# Patient Record
Sex: Female | Born: 1980 | Race: Black or African American | Hispanic: No | Marital: Single | Smoking: Current every day smoker
Health system: Southern US, Community
[De-identification: ages and names within clinical notes are randomized; demographics above are authoritative.]

## PROBLEM LIST (undated history)

## (undated) DIAGNOSIS — D219 Benign neoplasm of connective and other soft tissue, unspecified: Secondary | ICD-10-CM

## (undated) DIAGNOSIS — R87629 Unspecified abnormal cytological findings in specimens from vagina: Secondary | ICD-10-CM

## (undated) DIAGNOSIS — K219 Gastro-esophageal reflux disease without esophagitis: Secondary | ICD-10-CM

## (undated) DIAGNOSIS — J45909 Unspecified asthma, uncomplicated: Secondary | ICD-10-CM

## (undated) HISTORY — DX: Unspecified abnormal cytological findings in specimens from vagina: R87.629

## (undated) HISTORY — DX: Benign neoplasm of connective and other soft tissue, unspecified: D21.9

## (undated) HISTORY — DX: Unspecified asthma, uncomplicated: J45.909

## (undated) HISTORY — DX: Gastro-esophageal reflux disease without esophagitis: K21.9

---

## 2007-04-17 ENCOUNTER — Emergency Department: Payer: Self-pay | Admitting: Emergency Medicine

## 2008-06-27 ENCOUNTER — Emergency Department: Payer: Self-pay | Admitting: Internal Medicine

## 2009-01-30 ENCOUNTER — Emergency Department: Payer: Self-pay | Admitting: Emergency Medicine

## 2010-09-14 ENCOUNTER — Emergency Department: Payer: Self-pay | Admitting: Emergency Medicine

## 2011-02-05 ENCOUNTER — Emergency Department: Payer: Self-pay | Admitting: Emergency Medicine

## 2013-05-06 ENCOUNTER — Emergency Department: Payer: Self-pay | Admitting: Internal Medicine

## 2013-05-06 LAB — URINALYSIS, COMPLETE
Bilirubin,UR: NEGATIVE
Leukocyte Esterase: NEGATIVE
RBC,UR: 2 /HPF (ref 0–5)
Specific Gravity: 1.012 (ref 1.003–1.030)
Squamous Epithelial: 1
WBC UR: 1 /HPF (ref 0–5)

## 2013-05-06 LAB — HCG, QUANTITATIVE, PREGNANCY: Beta Hcg, Quant.: 8483 m[IU]/mL — ABNORMAL HIGH

## 2013-08-29 DIAGNOSIS — E039 Hypothyroidism, unspecified: Secondary | ICD-10-CM | POA: Insufficient documentation

## 2013-08-29 DIAGNOSIS — D259 Leiomyoma of uterus, unspecified: Secondary | ICD-10-CM | POA: Insufficient documentation

## 2013-11-09 DIAGNOSIS — Z8659 Personal history of other mental and behavioral disorders: Secondary | ICD-10-CM | POA: Insufficient documentation

## 2013-11-09 DIAGNOSIS — O1490 Unspecified pre-eclampsia, unspecified trimester: Secondary | ICD-10-CM | POA: Insufficient documentation

## 2013-11-09 DIAGNOSIS — O24419 Gestational diabetes mellitus in pregnancy, unspecified control: Secondary | ICD-10-CM | POA: Insufficient documentation

## 2014-04-23 ENCOUNTER — Emergency Department: Payer: Self-pay | Admitting: Emergency Medicine

## 2015-12-18 HISTORY — PX: COLPOSCOPY W/ BIOPSY / CURETTAGE: SUR283

## 2015-12-19 DIAGNOSIS — N879 Dysplasia of cervix uteri, unspecified: Secondary | ICD-10-CM | POA: Insufficient documentation

## 2015-12-25 LAB — HM PAP SMEAR

## 2016-03-30 ENCOUNTER — Encounter: Payer: Self-pay | Admitting: Emergency Medicine

## 2016-03-30 ENCOUNTER — Emergency Department
Admission: EM | Admit: 2016-03-30 | Discharge: 2016-03-30 | Disposition: A | Discharge status: Home / Self Care | Payer: Commercial Managed Care - HMO | Attending: Emergency Medicine | Admitting: Emergency Medicine

## 2016-03-30 DIAGNOSIS — F17210 Nicotine dependence, cigarettes, uncomplicated: Secondary | ICD-10-CM | POA: Diagnosis not present

## 2016-03-30 DIAGNOSIS — J029 Acute pharyngitis, unspecified: Secondary | ICD-10-CM | POA: Insufficient documentation

## 2016-03-30 DIAGNOSIS — E860 Dehydration: Secondary | ICD-10-CM

## 2016-03-30 DIAGNOSIS — J020 Streptococcal pharyngitis: Secondary | ICD-10-CM

## 2016-03-30 DIAGNOSIS — R509 Fever, unspecified: Secondary | ICD-10-CM | POA: Diagnosis present

## 2016-03-30 LAB — CBC WITH DIFFERENTIAL/PLATELET
BASOS ABS: 0 10*3/uL (ref 0–0.1)
Basophils Relative: 0 %
Eosinophils Absolute: 0 10*3/uL (ref 0–0.7)
HEMATOCRIT: 39.4 % (ref 35.0–47.0)
Hemoglobin: 13.1 g/dL (ref 12.0–16.0)
Lymphs Abs: 1.4 10*3/uL (ref 1.0–3.6)
MCH: 29.7 pg (ref 26.0–34.0)
MCHC: 33.2 g/dL (ref 32.0–36.0)
MCV: 89.7 fL (ref 80.0–100.0)
MONO ABS: 0.9 10*3/uL (ref 0.2–0.9)
NEUTROS ABS: 17 10*3/uL — AB (ref 1.4–6.5)
Neutrophils Relative %: 88 %
PLATELETS: 277 10*3/uL (ref 150–440)
RBC: 4.39 MIL/uL (ref 3.80–5.20)
RDW: 14.2 % (ref 11.5–14.5)
WBC: 19.4 10*3/uL — ABNORMAL HIGH (ref 3.6–11.0)

## 2016-03-30 LAB — COMPREHENSIVE METABOLIC PANEL
ALBUMIN: 4.1 g/dL (ref 3.5–5.0)
ALK PHOS: 48 U/L (ref 38–126)
ALT: 22 U/L (ref 14–54)
ANION GAP: 9 (ref 5–15)
AST: 25 U/L (ref 15–41)
BILIRUBIN TOTAL: 1.3 mg/dL — AB (ref 0.3–1.2)
BUN: 14 mg/dL (ref 6–20)
CO2: 22 mmol/L (ref 22–32)
Calcium: 9.1 mg/dL (ref 8.9–10.3)
Chloride: 103 mmol/L (ref 101–111)
Creatinine, Ser: 1.08 mg/dL — ABNORMAL HIGH (ref 0.44–1.00)
GFR calc Af Amer: >60 mL/min (ref >60)
GFR calc non Af Amer: >60 mL/min (ref >60)
GLUCOSE: 102 mg/dL — AB (ref 65–99)
POTASSIUM: 3.8 mmol/L (ref 3.5–5.1)
SODIUM: 134 mmol/L — AB (ref 135–145)
TOTAL PROTEIN: 8.4 g/dL — AB (ref 6.5–8.1)

## 2016-03-30 LAB — URINALYSIS COMPLETE WITH MICROSCOPIC (ARMC ONLY)
BACTERIA UA: NONE SEEN
Bilirubin Urine: NEGATIVE
GLUCOSE, UA: NEGATIVE mg/dL
Hgb urine dipstick: NEGATIVE
Ketones, ur: NEGATIVE mg/dL
Leukocytes, UA: NEGATIVE
Nitrite: NEGATIVE
PROTEIN: NEGATIVE mg/dL
SPECIFIC GRAVITY, URINE: 1.021 (ref 1.005–1.030)
pH: 7 (ref 5.0–8.0)

## 2016-03-30 LAB — POCT RAPID STREP A: Streptococcus, Group A Screen (Direct): POSITIVE — AB

## 2016-03-30 LAB — PREGNANCY, URINE: Preg Test, Ur: NEGATIVE

## 2016-03-30 MED ORDER — ACETAMINOPHEN 325 MG PO TABS
650.0000 mg | ORAL_TABLET | Freq: Once | ORAL | Status: AC | PRN
  Administered 2016-03-30: 650 mg via ORAL

## 2016-03-30 MED ORDER — SODIUM CHLORIDE 0.9 % IV BOLUS (SEPSIS)
1000.0000 mL | Freq: Once | INTRAVENOUS | Status: AC
  Administered 2016-03-30: 1000 mL via INTRAVENOUS

## 2016-03-30 MED ORDER — ACETAMINOPHEN 325 MG PO TABS
ORAL_TABLET | ORAL | Status: AC
  Administered 2016-03-30: 650 mg via ORAL
  Filled 2016-03-30: qty 2

## 2016-03-30 MED ORDER — ACETAMINOPHEN-CODEINE #3 300-30 MG PO TABS: 1.0000 | ORAL_TABLET | ORAL | Status: DC | PRN

## 2016-03-30 MED ORDER — AMOXICILLIN 500 MG PO TABS
500.0000 mg | ORAL_TABLET | Freq: Three times a day (TID) | ORAL | Status: DC
Start: 2016-03-30 — End: 2016-08-26

## 2016-03-30 NOTE — ED Notes (Signed)
Pt with sore throat, diarrhea, muscle weakness, fever 104 this morning per patient report.  Pt states she came to the ED because she was worried about the "tingling" in her legs, bilaterally.

## 2016-03-30 NOTE — ED Notes (Signed)
Pt presents with sore throat, leg pain, tingling, spasms. Pt had some diarrhea earlier today. Pt states that she thinks she was bitten by something. Pt also reports that she is confused, but when asked to expand on it, she states that she is having trouble saying what she is thinking. Pt alert & oriented.

## 2016-03-30 NOTE — Discharge Instructions (Signed)

## 2016-03-30 NOTE — ED Provider Notes (Signed)
[facility] Emergency Department Provider Note  ____________________________________________  Time seen: Approximately 3:18 PM  I have reviewed the triage vital signs and the nursing notes.   HISTORY  Chief Complaint Sore Throat; Fever; and Extremity Weakness   HPI Heidi Hill is a 35 y.o. female who presents to the emergency department for evaluation of sore throat, muscle spasms, fever, lower extremity tingling, and diarrhea that started this morning. She denies known sick contacts, however she works as a Secretary/administrator. She also has a 5-year-old that is in daycare. She denies chronic illness. She has taken Tylenol for the fever.  History reviewed. No pertinent past medical history.  There are no active problems to display for this patient.   History reviewed. No pertinent past surgical history.  Current Outpatient Rx  Name  Route  Sig  Dispense  Refill  . acetaminophen-codeine (TYLENOL #3) 300-30 MG tablet   Oral   Take 1-2 tablets by mouth every 4 (four) hours as needed for moderate pain.   12 tablet   0   . amoxicillin (AMOXIL) 500 MG tablet   Oral   Take 1 tablet (500 mg total) by mouth 3 (three) times daily.   30 tablet   0     Allergies Review of patient's allergies indicates no known allergies.  No family history on file.  Social History Social History  Substance Use Topics  . Smoking status: Current Every Day Smoker -- 0.50 packs/day    Types: Cigarettes  . Smokeless tobacco: None  . Alcohol Use: 1.2 oz/week    2 Glasses of wine per week    Review of Systems Constitutional: Positive for fever. Eyes: No visual changes. ENT: Positive for sore throat; negative for difficulty swallowing. Respiratory: Denies shortness of breath. Gastrointestinal: No abdominal pain.  No nausea, no vomiting.  No diarrhea.  Genitourinary: Negative for dysuria. Musculoskeletal: Positive for generalized body aches. Skin: Negative for  rash. Neurological: Positive for headaches, negative for focal weakness or numbness, positive for bilateral lower extremity tingling.  ____________________________________________   PHYSICAL EXAM:  VITAL SIGNS: ED Triage Vitals  Enc Vitals Group     BP 03/30/16 1407 107/64 mmHg     Pulse Rate 03/30/16 1407 127     Resp 03/30/16 1407 20     Temp 03/30/16 1407 102.8 F (39.3 C)     Temp Source 03/30/16 1407 Oral     SpO2 03/30/16 1407 98 %     Weight 03/30/16 1407 185 lb (83.915 kg)     Height 03/30/16 1407 5\' 7"  (1.702 m)     Head Cir --      Peak Flow --      Pain Score 03/30/16 1408 6     Pain Loc --      Pain Edu? --      Excl. in [place]? --     Constitutional: Alert and oriented. Well appearing and in no acute distress. Eyes: Conjunctivae are normal. PERRL. EOMI. Head: Atraumatic. Nose: No congestion/rhinnorhea. Mouth/Throat: Mucous membranes are moist.  Oropharynx Erythematous, with tonsillar exudate. Neck: No stridor.  Lymphatic: Lymphadenopathy: Anterior cervical nodes tender on palpation Cardiovascular: Normal rate, regular rhythm. Good peripheral circulation. Respiratory: Normal respiratory effort. Lungs CTAB. Gastrointestinal: Soft and nontender. Bowel sounds present 4 Musculoskeletal: No lower extremity tenderness nor edema.   Neurologic:  Normal speech and language. No gross focal neurologic deficits are appreciated. Speech is normal. No gait instability. Skin:  Skin is warm, dry and intact. No rash  noted Psychiatric: Mood and affect are normal. Speech and behavior are normal.  ____________________________________________   LABS (all labs ordered are listed, but only abnormal results are displayed)  Labs Reviewed  COMPREHENSIVE METABOLIC PANEL - Abnormal; Notable for the following:    Sodium 134 (*)    Glucose, Bld 102 (*)    Creatinine, Ser 1.08 (*)    Total Protein 8.4 (*)    Total Bilirubin 1.3 (*)    All other components within normal limits  CBC  WITH DIFFERENTIAL/PLATELET - Abnormal; Notable for the following:    WBC 19.4 (*)    Neutro Abs 17.0 (*)    All other components within normal limits  URINALYSIS COMPLETEWITH MICROSCOPIC ([facility] ONLY) - Abnormal; Notable for the following:    Color, Urine YELLOW (*)    APPearance CLEAR (*)    Squamous Epithelial / LPF 0-5 (*)    All other components within normal limits  POCT RAPID STREP A - Abnormal; Notable for the following:    Streptococcus, Group A Screen (Direct) POSITIVE (*)    All other components within normal limits  PREGNANCY, URINE   ____________________________________________  EKG   ____________________________________________  RADIOLOGY   ____________________________________________   PROCEDURES  Procedure(s) performed: None  Critical Care performed: No  ____________________________________________   INITIAL IMPRESSION / ASSESSMENT AND PLAN / ED COURSE  Pertinent labs & imaging results that were available during my care of the patient were reviewed by me and considered in my medical decision making (see chart for details).  Patient reports feeling much better after fluids and tylenol. She will be give prescriptions for Amoxicillin and tylenol #3. She was instructed to follow up with the PCP of her choice or return to the ER for symptoms that change or worsen if unable to schedule an appointment. ____________________________________________   FINAL CLINICAL IMPRESSION(S) / ED DIAGNOSES  Final diagnoses:  Dehydration, mild  Strep sore throat      Victorino Dike, FNP 03/30/16 1930  Hinda Kehr, MD 03/30/16 2031

## 2016-08-26 ENCOUNTER — Ambulatory Visit (INDEPENDENT_AMBULATORY_CARE_PROVIDER_SITE_OTHER): Payer: Commercial Managed Care - HMO | Admitting: Obstetrics and Gynecology

## 2016-08-26 ENCOUNTER — Encounter: Payer: Self-pay | Admitting: Obstetrics and Gynecology

## 2016-08-26 VITALS — BP 130/81 | HR 91 | Wt 194.8 lb

## 2016-08-26 DIAGNOSIS — N898 Other specified noninflammatory disorders of vagina: Secondary | ICD-10-CM

## 2016-08-26 DIAGNOSIS — Z720 Tobacco use: Secondary | ICD-10-CM | POA: Insufficient documentation

## 2016-08-26 DIAGNOSIS — D259 Leiomyoma of uterus, unspecified: Secondary | ICD-10-CM | POA: Diagnosis not present

## 2016-08-26 DIAGNOSIS — N946 Dysmenorrhea, unspecified: Secondary | ICD-10-CM

## 2016-08-26 DIAGNOSIS — Z7251 High risk heterosexual behavior: Secondary | ICD-10-CM | POA: Diagnosis not present

## 2016-08-26 NOTE — Patient Instructions (Signed)
1. Ultrasound is scheduled 2. Screening labs for STDs are completed today 3. Return in 2 weeks for follow-up and further management planning 4. Recommend smoking cessation if you are to remain on birth control pills   Uterine Fibroids Uterine fibroids are tissue masses (tumors) that can develop in the womb (uterus). They are also called leiomyomas. This type of tumor is not cancerous (benign) and does not spread to other parts of the body outside of the pelvic area, which is between the hip bones. Occasionally, fibroids may develop in the fallopian tubes, in the cervix, or on the support structures (ligaments) that surround the uterus. You can have one or many fibroids. Fibroids can vary in size, weight, and where they grow in the uterus. Some can become quite large. Most fibroids do not require medical treatment. CAUSES A fibroid can develop when a single uterine cell keeps growing (replicating). Most cells in the human body have a control mechanism that keeps them from replicating without control. SIGNS AND SYMPTOMS Symptoms may include:   Heavy bleeding during your period.  Bleeding or spotting between periods.  Pelvic pain and pressure.  Bladder problems, such as needing to urinate more often (urinary frequency) or urgently.  Inability to reproduce offspring (infertility).  Miscarriages. DIAGNOSIS Uterine fibroids are diagnosed through a physical exam. Your health care provider may feel the lumpy tumors during a pelvic exam. Ultrasonography and an MRI may be done to determine the size, location, and number of fibroids. TREATMENT Treatment may include:  Watchful waiting. This involves getting the fibroid checked by your health care provider to see if it grows or shrinks. Follow your health care provider's recommendations for how often to have this checked.  Hormone medicines. These can be taken by mouth or given through an intrauterine device (IUD).  Surgery.  Removing the  fibroids (myomectomy) or the uterus (hysterectomy).  Removing blood supply to the fibroids (uterine artery embolization). If fibroids interfere with your fertility and you want to become pregnant, your health care provider may recommend having the fibroids removed.  HOME CARE INSTRUCTIONS  Keep all follow-up visits as directed by your health care provider. This is important.  Take medicines only as directed by your health care provider.  If you were prescribed a hormone treatment, take the hormone medicines exactly as directed.  Do not take aspirin, because it can cause bleeding.  Ask your health care provider about taking iron pills and increasing the amount of dark green, leafy vegetables in your diet. These actions can help to boost your blood iron levels, which may be affected by heavy menstrual bleeding.  Pay close attention to your period and tell your health care provider about any changes, such as:  Increased blood flow that requires you to use more pads or tampons than usual per month.  A change in the number of days that your period lasts per month.  A change in symptoms that are associated with your period, such as abdominal cramping or back pain. SEEK MEDICAL CARE IF:  You have pelvic pain, back pain, or abdominal cramps that cannot be controlled with medicines.  You have an increase in bleeding between and during periods.  You soak tampons or pads in a half hour or less.  You feel lightheaded, extra tired, or weak. SEEK IMMEDIATE MEDICAL CARE IF:  You faint.  You have a sudden increase in pelvic pain.   This information is not intended to replace advice given to you by your health care provider.  Make sure you discuss any questions you have with your health care provider.   Document Released: 10/02/2000 Document Revised: 10/26/2014 Document Reviewed: 04/03/2014 Elsevier Interactive Patient Education [employer].    Endometriosis Endometriosis is a  condition in which the tissue that lines the uterus (endometrium) grows outside of its normal location. The tissue may grow in many locations close to the uterus, but it commonly grows on the ovaries, fallopian tubes, vagina, or bowel. Because the uterus expels, or sheds, its lining every menstrual cycle, there is bleeding wherever the endometrial tissue is located. This can cause pain because blood is irritating to tissues not normally exposed to it.  CAUSES  The cause of endometriosis is not known.  SIGNS AND SYMPTOMS  Often, there are no symptoms. When symptoms are present, they can vary with the location of the displaced tissue. Various symptoms can occur at different times. Although symptoms occur mainly during a woman's menstrual period, they can also occur midcycle and usually stop with menopause. Some people may go months with no symptoms at all. Symptoms may include:   Back or abdominal pain.   Heavier bleeding during periods.   Pain during intercourse.   Painful bowel movements.   Infertility. DIAGNOSIS  Your health care provider will do a physical exam and ask about your symptoms. Various tests may be done, such as:   Blood tests and urine tests. These are done to help rule out other problems.   Ultrasound. This test is done to look for abnormal tissue.   An X-ray of the lower bowel (barium enema).  Laparoscopy. In this procedure, a thin, lighted tube with a tiny camera on the end (laparoscope) is inserted into your abdomen. This helps your health care provider look for abnormal tissue to confirm the diagnosis. The health care provider may also remove a small piece of tissue (biopsy) from any abnormal tissue found. This tissue sample can then be sent to a lab so it can be looked at under a microscope. TREATMENT  Treatment will vary and may include:   Medicines to relieve pain. Nonsteroidal anti-inflammatory drugs (NSAIDs) are a type of pain medicine that can help to  relieve the pain caused by endometriosis.  Hormonal therapy. When using hormonal therapy, periods are eliminated. This eliminates the monthly exposure to blood by the displaced endometrial tissue.   Surgery. Surgery may sometimes be done to remove the abnormal endometrial tissue. In severe cases, surgery may be done to remove the fallopian tubes, uterus, and ovaries (hysterectomy). HOME CARE INSTRUCTIONS   Take all medicines as directed by your health care provider. Do not take aspirin because it may increase bleeding when you are not on hormonal therapy.   Avoid activities that produce pain, including sexual activity. SEEK MEDICAL CARE IF:  You have pelvic pain before, after, or during your periods.  You have pelvic pain between periods that gets worse during your period.  You have pelvic pain during or after sex.  You have pelvic pain with bowel movements or urination, especially during your period.  You have problems getting pregnant.  You have a fever. SEEK IMMEDIATE MEDICAL CARE IF:   Your pain is severe and is not responding to pain medicine.   You have severe nausea and vomiting, or you cannot keep foods down.   You have pain that is limited to the right lower part of your abdomen.   You have swelling or increasing pain in your abdomen.   You see blood in  your stool.  MAKE SURE YOU:   Understand these instructions.  Will watch your condition.  Will get help right away if you are not doing well or get worse.   This information is not intended to replace advice given to you by your health care provider. Make sure you discuss any questions you have with your health care provider.   Document Released: 10/02/2000 Document Revised: 10/26/2014 Document Reviewed: 06/02/2013 Elsevier Interactive Patient Education [employer].

## 2016-08-26 NOTE — Progress Notes (Signed)
GYN ENCOUNTER NOTE  Subjective:       Heidi Hill is a 35 y.o. G2P0111 female is here for gynecologic evaluation of the following issues:  1. Uterine fibroids 2. Severe dysmenorrhea 3. Establish care  OB history is notable for spontaneous vaginal delivery of 3 pound baby at [redacted] weeks gestation due to preterm labor and preeclampsia. Patient also had a spontaneous abortion which did not require a D&C.   GYN history notable for positive HPV on Pap smear in 2017; she is status post colposcopy without evidence of dysplasia; she is recommended to have repeat Pap smear testing in 2018. Patient has long history of severe dysmenorrhea requiring high-dose ibuprofen therapy; she has missed work because of the pain. In the past she was told that she had endometriosis but this never confirmed. She also has history of uterine fibroids identified during her pregnancy.  Gynecologic History Patient's last menstrual period was 08/16/2016. Contraception: OCP (estrogen/progesterone) Last Pap: Positive high-risk HPV; Colposcopy at [facility] in March 2017 negative Last mammogram: N/A Menarche: Age 89 Intervals: Monthly Duration: 5 days-3 days of bleeding followed by 2 days of spotting Dysmenorrhea: Severe 9/10: Requires approximately 8 or 9 Advil per day; pain is right lower quadrant and associated low back pain with radiation into the buttocks and thighs; has missed work in the past because of the severity of dysmenorrhea.  Obstetric History OB History  Gravida Para Term Preterm AB Living  2 1   1 1 1   SAB TAB Ectopic Multiple Live Births  1       1    # Outcome Date GA Lbr Len/2nd Weight Sex Delivery Anes PTL Lv  2 Preterm 11/24/13 [redacted]w[redacted]d  3 lb (1.361 kg) F Vag-Spont  Y LIV  1 SAB 2005        ND      Past Medical History:  Diagnosis Date  . Fibroids   . GERD (gastroesophageal reflux disease)     Past Surgical History:  Procedure Laterality Date  . COLPOSCOPY W/ BIOPSY / CURETTAGE  12/2015     No current outpatient prescriptions on file prior to visit.   No current facility-administered medications on file prior to visit.     No Known Allergies  Social History   Social History  . Marital status: Single    Spouse name: N/A  . Number of children: N/A  . Years of education: N/A   Occupational History  . Not on file.   Social History Main Topics  . Smoking status: Current Every Day Smoker    Packs/day: 0.50    Types: Cigarettes  . Smokeless tobacco: Never Used  . Alcohol use 1.2 oz/week    2 Glasses of wine per week  . Drug use: No  . Sexual activity: Not Currently   Other Topics Concern  . Not on file   Social History Narrative  . No narrative on file    Family History  Problem Relation Age of Onset  . Diabetes Mother     The following portions of the patient's history were reviewed and updated as appropriate: allergies, current medications, past family history, past medical history, past social history, past surgical history and problem list.  Review of Systems Review of Systems - Per history of present illness  Objective:   BP 130/81   Pulse 91   Wt 194 lb 12.8 oz (88.4 kg)   LMP 08/16/2016   BMI 30.51 kg/m  CONSTITUTIONAL: Well-developed, well-nourished female in no acute distress.  HENT:  Normocephalic, atraumatic.  NECK: Normal range of motion, supple, no masses.  Normal thyroid.  SKIN: Skin is warm and dry. No rash noted. Not diaphoretic. No erythema. No pallor. [place]: Alert and oriented to person, place, and time. PSYCHIATRIC: Normal mood and affect. Normal behavior. Normal judgment and thought content. CARDIOVASCULAR:Not Examined RESPIRATORY: Not Examined BREASTS: Not Examined ABDOMEN: Soft, non distended; Non tender.  No Organomegaly. PELVIC:  External Genitalia: Normal  BUS: Normal  Vagina: Pearline Cables white secretions vaginal vault; no lesions  Cervix: Normal; no lesions; mild cervical motion tenderness 1/4  Uterus: Enlarged,  irregular, 12-14 weeks' size, mobile, 2/4 tender  Adnexa: Normal; nonpalpable and nontender  RV: Normal external exam  Bladder: Nontender MUSCULOSKELETAL: Normal range of motion. No tenderness.  No cyanosis, clubbing, or edema.     Assessment:   1. Unprotected sex - GC/Chlamydia Probe Amp - Hepatitis B surface antigen - HIV antibody - RPR  2. Dysmenorrhea  3. Uterine leiomyoma, unspecified location, 12-14 weeks' size  4. Tobacco user, light  5. Contraception-OCPs     Plan:   1. STI screening 2. Pelvic ultrasound 3. Return in 2 weeks for follow-up and further management planning 4. Literature on fibroids and endometriosis is given 5. Endometriosis management including medical and surgical options were reviewed 6. Smoking cessation strongly encouraged  Brayton Mars, MD  Note: This dictation was prepared with Dragon dictation along with smaller phrase technology. Any transcriptional errors that result from this process are unintentional.

## 2016-08-27 LAB — HSV(HERPES SIMPLEX VRS) I + II AB-IGG

## 2016-08-27 LAB — HIV ANTIBODY (ROUTINE TESTING W REFLEX): HIV Screen 4th Generation wRfx: NONREACTIVE

## 2016-08-27 LAB — HEPATITIS B SURFACE ANTIGEN: Hepatitis B Surface Ag: NEGATIVE

## 2016-08-27 LAB — RPR: RPR: NONREACTIVE

## 2016-08-28 ENCOUNTER — Telehealth: Payer: Self-pay

## 2016-08-28 MED ORDER — METRONIDAZOLE 0.75 % VA GEL: 1.0000 | Freq: Every day | VAGINAL | 0 refills | Status: AC

## 2016-08-28 NOTE — Telephone Encounter (Signed)
Pt states she knows she has BV because she has had it before. Symptoms include: vaginal itching, odor, and discharge. Dr. Enzo Bi prescribed Metrogel Vag. Cream, 1 applicator at bedtime, x5 days. Rx sent in.

## 2016-08-29 LAB — GC/CHLAMYDIA PROBE AMP
CHLAMYDIA, DNA PROBE: NEGATIVE
Neisseria gonorrhoeae by PCR: NEGATIVE

## 2016-09-02 ENCOUNTER — Ambulatory Visit (INDEPENDENT_AMBULATORY_CARE_PROVIDER_SITE_OTHER): Payer: Commercial Managed Care - HMO

## 2016-09-02 DIAGNOSIS — D259 Leiomyoma of uterus, unspecified: Secondary | ICD-10-CM | POA: Diagnosis not present

## 2016-09-02 DIAGNOSIS — N946 Dysmenorrhea, unspecified: Secondary | ICD-10-CM | POA: Diagnosis not present

## 2016-09-15 ENCOUNTER — Encounter: Payer: Commercial Managed Care - HMO | Admitting: Obstetrics and Gynecology

## 2016-09-29 ENCOUNTER — Encounter: Payer: Commercial Managed Care - HMO | Admitting: Obstetrics and Gynecology

## 2017-02-06 ENCOUNTER — Emergency Department
Admission: EM | Admit: 2017-02-06 | Discharge: 2017-02-06 | Disposition: A | Discharge status: Home / Self Care | Payer: Commercial Managed Care - HMO | Attending: Emergency Medicine | Admitting: Emergency Medicine

## 2017-02-06 ENCOUNTER — Encounter: Payer: Self-pay | Admitting: Medical Oncology

## 2017-02-06 DIAGNOSIS — J020 Streptococcal pharyngitis: Secondary | ICD-10-CM | POA: Diagnosis not present

## 2017-02-06 DIAGNOSIS — J029 Acute pharyngitis, unspecified: Secondary | ICD-10-CM | POA: Diagnosis present

## 2017-02-06 DIAGNOSIS — F17210 Nicotine dependence, cigarettes, uncomplicated: Secondary | ICD-10-CM | POA: Diagnosis not present

## 2017-02-06 LAB — POCT RAPID STREP A: Streptococcus, Group A Screen (Direct): POSITIVE — AB

## 2017-02-06 MED ORDER — AMOXICILLIN 500 MG PO CAPS: 500.0000 mg | ORAL_CAPSULE | Freq: Two times a day (BID) | ORAL | 0 refills | Status: DC

## 2017-02-06 MED ORDER — IBUPROFEN 800 MG PO TABS
800.0000 mg | ORAL_TABLET | Freq: Once | ORAL | Status: AC
  Administered 2017-02-06: 800 mg via ORAL
  Filled 2017-02-06: qty 1

## 2017-02-06 MED ORDER — LIDOCAINE VISCOUS 2 % MT SOLN: 10.0000 mL | OROMUCOSAL | 0 refills | Status: DC | PRN

## 2017-02-06 MED ORDER — SODIUM CHLORIDE 0.9 % IV BOLUS (SEPSIS)
1000.0000 mL | Freq: Once | INTRAVENOUS | Status: AC
  Administered 2017-02-06: 1000 mL via INTRAVENOUS

## 2017-02-06 MED ORDER — AMOXICILLIN 500 MG PO CAPS
500.0000 mg | ORAL_CAPSULE | Freq: Once | ORAL | Status: AC
  Administered 2017-02-06: 500 mg via ORAL
  Filled 2017-02-06: qty 1

## 2017-02-06 NOTE — ED Notes (Signed)
Completing discharge vital signs , pt noted to have abnormal vitals , see vital flow sheet, ED provider aware, new orders received, Discharge being held til re-eval.

## 2017-02-06 NOTE — ED Notes (Addendum)
IV fluids infusing with no difficulty, pt moving about, complaining of right side chest pain under right breast. Skin warm and dry,, ED provider Caryl Pina aware,baseline EKG ordered

## 2017-02-06 NOTE — ED Provider Notes (Signed)
[facility] Emergency Department Provider Note  ____________________________________________  Time seen: Approximately 9:56 AM  I have reviewed the triage vital signs and the nursing notes.   HISTORY  Chief Complaint Sore Throat    HPI Heidi Hill is a 36 y.o. female that presents to the emergency department with one day of sore throat. Patient states that she woke up in the middle of the night with chills and this morning her throat started to hurt. She states that currenlty her muscles hurt all over. She has a Mudlogger with strep throat. She denies any recent illnesses. No alleviating measures have been tried. She denies nasal congestion, shortness of breath, chest pain, nausea, vomiting, abdominal pain, diarrhea, constipation.   Past Medical History:  Diagnosis Date  . Fibroids   . GERD (gastroesophageal reflux disease)     Patient Active Problem List   Diagnosis Date Noted  . Dysmenorrhea 08/26/2016  . Tobacco user 08/26/2016  . Cervical dysplasia 12/19/2015  . Gestational diabetes 11/09/2013  . History of depression 11/09/2013  . Preeclampsia 11/09/2013  . Thyroid activity decreased 08/29/2013  . Uterine leiomyoma 08/29/2013    Past Surgical History:  Procedure Laterality Date  . COLPOSCOPY W/ BIOPSY / CURETTAGE  12/2015    Prior to Admission medications   Medication Sig Start Date End Date Taking? Authorizing Provider  amoxicillin (AMOXIL) 500 MG capsule Take 1 capsule (500 mg total) by mouth 2 (two) times daily. 02/06/17   Laban Emperor, PA-C  lidocaine (XYLOCAINE) 2 % solution Use as directed 10 mLs in the mouth or throat as needed for mouth pain. 02/06/17   Laban Emperor, PA-C  omeprazole (PRILOSEC) 10 MG capsule Take 20 mg by mouth.    Historical Provider, MD    Allergies Patient has no known allergies.  Family History  Problem Relation Age of Onset  . Diabetes Mother     Social History Social History  Substance Use Topics   . Smoking status: Current Every Day Smoker    Packs/day: 0.50    Types: Cigarettes  . Smokeless tobacco: Never Used  . Alcohol use 1.2 oz/week    2 Glasses of wine per week     Review of Systems  Eyes: No visual changes. No discharge. ENT: Negative for congestion and rhinorrhea. Cardiovascular: No chest pain. Respiratory: Negative for cough. No SOB. Gastrointestinal: No abdominal pain.  No nausea, no vomiting.  No diarrhea.  No constipation. Skin: Negative for rash, abrasions, lacerations, ecchymosis. Neurological: Negative for headaches.   ____________________________________________   PHYSICAL EXAM:  VITAL SIGNS: ED Triage Vitals [02/06/17 0850]  Enc Vitals Group     BP 128/69     Pulse Rate (!) 107     Resp 18     Temp 98.5 F (36.9 C)     Temp Source Oral     SpO2 98 %     Weight 185 lb (83.9 kg)     Height 5\' 7"  (1.702 m)     Head Circumference      Peak Flow      Pain Score 8     Pain Loc      Pain Edu?      Excl. in [place]?      Constitutional: Alert and oriented. Well appearing and in no acute distress. Eyes: Conjunctivae are normal. PERRL. EOMI. No discharge. Head: Atraumatic. ENT: No frontal and maxillary sinus tenderness.      Ears: Tympanic membranes pearly gray with good landmarks. No discharge.  Nose: No congestion/rhinnorhea.      Mouth/Throat: Mucous membranes are moist. Oropharynx erythematous. Tonsils enlarged bilaterally. No exudates. Uvula midline. Neck: No stridor.   Hematological/Lymphatic/Immunilogical: No cervical lymphadenopathy. Cardiovascular: Normal rate, regular rhythm.  Good peripheral circulation. Respiratory: Normal respiratory effort without tachypnea or retractions. Lungs CTAB. Good air entry to the bases with no decreased or absent breath sounds. Gastrointestinal: Bowel sounds 4 quadrants. Soft and nontender to palpation. No guarding or rigidity. No palpable masses. No distention. Musculoskeletal: Full range of motion to  all extremities. No gross deformities appreciated. Neurologic:  Normal speech and language. No gross focal neurologic deficits are appreciated.  Skin:  Skin is warm, dry and intact. No rash noted.   ____________________________________________   LABS (all labs ordered are listed, but only abnormal results are displayed)  Labs Reviewed  POCT RAPID STREP A - Abnormal; Notable for the following:       Result Value   Streptococcus, Group A Screen (Direct) POSITIVE (*)    All other components within normal limits   ____________________________________________  EKG   ____________________________________________  RADIOLOGY   No results found.  ____________________________________________    PROCEDURES  Procedure(s) performed:    Procedures    Medications  ibuprofen (ADVIL,MOTRIN) tablet 800 mg (800 mg Oral Given 02/06/17 1015)  sodium chloride 0.9 % bolus 1,000 mL (0 mLs Intravenous Stopped 02/06/17 1147)  amoxicillin (AMOXIL) capsule 500 mg (500 mg Oral Given 02/06/17 1133)     ____________________________________________   INITIAL IMPRESSION / ASSESSMENT AND PLAN / ED COURSE  Pertinent labs & imaging results that were available during my care of the patient were reviewed by me and considered in my medical decision making (see chart for details).  Review of the [place] CSRS was performed in accordance of the [place] prior to dispensing any controlled drugs.   Patient's diagnosis is consistent with strep throat. Vital signs and exam are reassuring. Strep test positive. On discharge patient developed a fever of 102 and HR increased to 115. She was given fluids and ibuprofen.Temperature came down to 99.7 and HR down to 100. Patient stated that she had some pain under her right breast that felt like cramping. No cardiac changes indicated on EKG. Patient was eager to go home. Patient will be discharged home with prescriptions for amoxicillin and viscous lidocaine. Patient is to  follow up with PCP as needed or otherwise directed. Patient is given ED precautions to return to the ED for any worsening or new symptoms.     ____________________________________________  FINAL CLINICAL IMPRESSION(S) / ED DIAGNOSES  Final diagnoses:  Strep pharyngitis      NEW MEDICATIONS STARTED DURING THIS VISIT:  Discharge Medication List as of 02/06/2017 10:01 AM    START taking these medications   Details  amoxicillin (AMOXIL) 500 MG capsule Take 1 capsule (500 mg total) by mouth 2 (two) times daily., Starting Sat 02/06/2017, Print    lidocaine (XYLOCAINE) 2 % solution Use as directed 10 mLs in the mouth or throat as needed for mouth pain., Starting Sat 02/06/2017, Print            This chart was dictated using voice recognition software/Dragon. Despite best efforts to proofread, errors can occur which can change the meaning. Any change was purely unintentional.    Laban Emperor, PA-C 02/06/17 1215    Lavonia Drafts, MD 02/06/17 1248

## 2017-02-06 NOTE — ED Triage Notes (Signed)
Sore throat that began last night. 

## 2017-02-06 NOTE — ED Provider Notes (Signed)
Apolonio Schneiders, attending physician, personally viewed and interpreted this EKG  EKG Time: 1113 Rate: 105 Rhythm: sinus tachycardia Axis: normal Intervals: qtc 457 QRS: narrow ST changes: no st elevation Impression: sinus tachycardia, otherwise normal ekg    Nance Pear, MD 02/06/17 1119

## 2017-09-24 ENCOUNTER — Emergency Department: Payer: 59

## 2017-09-24 ENCOUNTER — Emergency Department
Admission: EM | Admit: 2017-09-24 | Discharge: 2017-09-24 | Disposition: A | Discharge status: Home / Self Care | Payer: 59 | Attending: Emergency Medicine | Admitting: Emergency Medicine

## 2017-09-24 ENCOUNTER — Encounter: Payer: Self-pay | Admitting: Emergency Medicine

## 2017-09-24 DIAGNOSIS — Z79899 Other long term (current) drug therapy: Secondary | ICD-10-CM | POA: Diagnosis not present

## 2017-09-24 DIAGNOSIS — W2203XA Walked into furniture, initial encounter: Secondary | ICD-10-CM | POA: Diagnosis not present

## 2017-09-24 DIAGNOSIS — F17210 Nicotine dependence, cigarettes, uncomplicated: Secondary | ICD-10-CM | POA: Insufficient documentation

## 2017-09-24 DIAGNOSIS — Z8632 Personal history of gestational diabetes: Secondary | ICD-10-CM | POA: Diagnosis not present

## 2017-09-24 DIAGNOSIS — S90121A Contusion of right lesser toe(s) without damage to nail, initial encounter: Secondary | ICD-10-CM

## 2017-09-24 DIAGNOSIS — Y999 Unspecified external cause status: Secondary | ICD-10-CM | POA: Diagnosis not present

## 2017-09-24 DIAGNOSIS — S99921A Unspecified injury of right foot, initial encounter: Secondary | ICD-10-CM | POA: Diagnosis present

## 2017-09-24 DIAGNOSIS — Y939 Activity, unspecified: Secondary | ICD-10-CM | POA: Diagnosis not present

## 2017-09-24 DIAGNOSIS — Y929 Unspecified place or not applicable: Secondary | ICD-10-CM | POA: Insufficient documentation

## 2017-09-24 MED ORDER — TRAMADOL HCL 50 MG PO TABS: 50.0000 mg | ORAL_TABLET | Freq: Four times a day (QID) | ORAL | 0 refills | Status: DC | PRN

## 2017-09-24 NOTE — ED Provider Notes (Signed)
[facility] Emergency Department Provider Note ____________________________________________  Time seen: Approximately 4:15 PM  I have reviewed the triage vital signs and the nursing notes.   HISTORY  Chief Complaint Toe Pain    HPI Heidi Hill is a 36 y.o. female who presents to the emergency department for evaluation of pain in the right fifth toe after she struck it on the bed this morning.  She states that as the hours passed, it began to become more painful, blue, and swelling.  She is having trouble putting on her shoe.  She has not taken any medications since the injury.  Past Medical History:  Diagnosis Date  . Fibroids   . GERD (gastroesophageal reflux disease)     Patient Active Problem List   Diagnosis Date Noted  . Dysmenorrhea 08/26/2016  . Tobacco user 08/26/2016  . Cervical dysplasia 12/19/2015  . Gestational diabetes 11/09/2013  . History of depression 11/09/2013  . Preeclampsia 11/09/2013  . Thyroid activity decreased 08/29/2013  . Uterine leiomyoma 08/29/2013    Past Surgical History:  Procedure Laterality Date  . COLPOSCOPY W/ BIOPSY / CURETTAGE  12/2015    Prior to Admission medications   Medication Sig Start Date End Date Taking? Authorizing Provider  amoxicillin (AMOXIL) 500 MG capsule Take 1 capsule (500 mg total) by mouth 2 (two) times daily. 02/06/17   Laban Emperor, PA-C  lidocaine (XYLOCAINE) 2 % solution Use as directed 10 mLs in the mouth or throat as needed for mouth pain. 02/06/17   Laban Emperor, PA-C  omeprazole (PRILOSEC) 10 MG capsule Take 20 mg by mouth.    [provider]  traMADol (ULTRAM) 50 MG tablet Take 1 tablet (50 mg total) by mouth every 6 (six) hours as needed. 09/24/17   Victorino Dike, FNP    Allergies Patient has no known allergies.  Family History  Problem Relation Age of Onset  . Diabetes Mother     Social History Social History   Tobacco Use  . Smoking status: Current  Every Day Smoker    Packs/day: 0.50    Types: Cigarettes  . Smokeless tobacco: Never Used  Substance Use Topics  . Alcohol use: Yes    Alcohol/week: 1.2 oz    Types: 2 Glasses of wine per week  . Drug use: No    Review of Systems Constitutional: Negative for recent illness Cardiovascular: Negative for chest pain Respiratory: Negative for shortness of breath or cough Musculoskeletal: Positive for right fifth toe pain Skin: Positive for bruising and swelling of the right fifth toe Neurological: Negative for paresthesias  ____________________________________________   PHYSICAL EXAM:  VITAL SIGNS: ED Triage Vitals  Enc Vitals Group     BP 09/24/17 0852 125/88     Pulse Rate 09/24/17 0852 79     Resp 09/24/17 0852 16     Temp 09/24/17 0852 98 F (36.7 C)     Temp Source 09/24/17 0852 Oral     SpO2 09/24/17 0852 98 %     Weight 09/24/17 0855 180 lb (81.6 kg)     Height 09/24/17 0855 5\' 7"  (1.702 m)     Head Circumference --      Peak Flow --      Pain Score 09/24/17 0855 9     Pain Loc --      Pain Edu? --      Excl. in [place]? --     Constitutional: Alert and oriented. Well appearing and in no acute distress. Eyes:  Conjunctivae are clear without discharge or drainage Head: Atraumatic Neck: Supple Respiratory: Respirations even and unlabored Musculoskeletal: Assessment of right foot reveals swelling and ecchymosis of the small toe without obvious deformity. Neurologic: Sensation of the right foot is intact Skin: Ecchymosis over the right fifth toe Psychiatric: Behavior and affect are appropriate  ____________________________________________   LABS (all labs ordered are listed, but only abnormal results are displayed)  Labs Reviewed - No data to display ____________________________________________  RADIOLOGY  Image of the fifth toe right foot negative for acute bony abnormality per  radiology ____________________________________________   PROCEDURES  Procedures  ____________________________________________   INITIAL IMPRESSION / ASSESSMENT AND PLAN / ED COURSE  Heidi Hill is a 36 y.o. female who presents to the emergency department for evaluation of pain after stubbing her toe on bed this morning.  While in the ER, her small toe was buddy taped to her fourth toe on the right foot and she was placed in a postop shoe.  Home care instructions were discussed.  She is to follow-up with her primary care provider if her symptoms are not improving over the next few days.  She was encouraged to return to the emergency department for symptoms of change or worsen if she is unable to schedule an appointment.  Medications - No data to display  Pertinent labs & imaging results that were available during my care of the patient were reviewed by me and considered in my medical decision making (see chart for details).  _________________________________________   FINAL CLINICAL IMPRESSION(S) / ED DIAGNOSES  Final diagnoses:  Contusion of fifth toe, right, initial encounter    ED Discharge Orders        Ordered    traMADol (ULTRAM) 50 MG tablet  Every 6 hours PRN     09/24/17 0943       If controlled substance prescribed during this visit, 12 month history viewed on the [place] prior to issuing an initial prescription for Schedule II or III opiod.    Victorino Dike, FNP 09/24/17 [place], [place], MD 09/25/17 [phone]

## 2017-09-24 NOTE — ED Notes (Signed)
First Nurse Note:  Patient states she stubbed her right pinky toe on her bed this morning and now toe is swollen and purple.

## 2017-09-24 NOTE — ED Triage Notes (Signed)
Patient is complaining of pain to right pinky toe after hitting it on the bed this morning.

## 2017-09-24 NOTE — ED Notes (Signed)
See triage note  States she hit her bed  Pain and bruising noted to right 5 th toe

## 2017-11-26 DIAGNOSIS — J028 Acute pharyngitis due to other specified organisms: Secondary | ICD-10-CM | POA: Diagnosis not present

## 2017-11-26 DIAGNOSIS — J029 Acute pharyngitis, unspecified: Secondary | ICD-10-CM | POA: Diagnosis not present

## 2018-09-01 ENCOUNTER — Ambulatory Visit (INDEPENDENT_AMBULATORY_CARE_PROVIDER_SITE_OTHER): Payer: BLUE CROSS/BLUE SHIELD | Admitting: Obstetrics and Gynecology

## 2018-09-01 ENCOUNTER — Other Ambulatory Visit (HOSPITAL_COMMUNITY)
Admission: RE | Admit: 2018-09-01 | Discharge: 2018-09-01 | Disposition: A | Discharge status: Home / Self Care | Payer: BLUE CROSS/BLUE SHIELD | Source: Ambulatory Visit | Attending: Obstetrics and Gynecology | Admitting: Obstetrics and Gynecology

## 2018-09-01 ENCOUNTER — Encounter: Payer: Self-pay | Admitting: Obstetrics and Gynecology

## 2018-09-01 VITALS — BP 128/85 | Ht 67.0 in | Wt 202.2 lb

## 2018-09-01 DIAGNOSIS — N946 Dysmenorrhea, unspecified: Secondary | ICD-10-CM

## 2018-09-01 DIAGNOSIS — D259 Leiomyoma of uterus, unspecified: Secondary | ICD-10-CM

## 2018-09-01 DIAGNOSIS — Z202 Contact with and (suspected) exposure to infections with a predominantly sexual mode of transmission: Secondary | ICD-10-CM | POA: Diagnosis not present

## 2018-09-01 DIAGNOSIS — Z124 Encounter for screening for malignant neoplasm of cervix: Secondary | ICD-10-CM | POA: Insufficient documentation

## 2018-09-01 DIAGNOSIS — Z720 Tobacco use: Secondary | ICD-10-CM | POA: Diagnosis not present

## 2018-09-01 DIAGNOSIS — Z01419 Encounter for gynecological examination (general) (routine) without abnormal findings: Secondary | ICD-10-CM | POA: Insufficient documentation

## 2018-09-01 DIAGNOSIS — J45909 Unspecified asthma, uncomplicated: Secondary | ICD-10-CM | POA: Insufficient documentation
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
## 2018-09-01 NOTE — Patient Instructions (Addendum)
1.  Pap smear is done. 2.  Self breast awareness is encouraged. 3.  Screening labs are ordered.  STD testing will be performed today. 4.  Continue with healthy healthy eating and exercise. 5.  Contraception-vasectomy 6.  Pelvic ultrasound is ordered to assess uterine fibroids and ovaries 7.  Return in January  2020 for follow-up on fibroids-Dr. Amalia Hailey 8.  Return in 1 year for annual exam-Dr. Amalia Hailey 9.  Hysterectomy will be scheduled at follow-up visit with Dr. Amalia Hailey in January 2020 10.  Smoking cessation strongly encouraged   Hysterectomy Information A hysterectomy is a surgery to remove your uterus. After surgery, you will no longer have periods. Also, you will not be able to get pregnant. Reasons for this surgery  You have bleeding that is not normal and keeps coming back.  You have lasting (chronic) lower belly (pelvic) pain.  You have a lasting infection.  The lining of your uterus grows outside your uterus.  The lining of your uterus grows in the muscle of your uterus.  Your uterus falls down into your vagina.  You have a growth in your uterus that causes problems.  You have cells that could turn into cancer (precancerous cells).  You have cancer of the uterus or cervix. Types There are 3 types of hysterectomies. Depending on the type, the surgery will:  Remove the top part of the uterus only.  Remove the uterus and the cervix.  Remove the uterus, cervix, and tissue that holds the uterus in place in the lower belly.  Ways a hysterectomy can be performed There are 5 ways this surgery can be performed.  A cut (incision) is made in the belly (abdomen). The uterus is taken out through the cut.  A cut is made in the vagina. The uterus is taken out through the cut.  Three or four cuts are made in the belly. A surgical device with a camera is put through one of the cuts. The uterus is cut into small pieces. The uterus is taken out through the cuts or the vagina.  Three  or four cuts are made in the belly. A surgical device with a camera is put through one of the cuts. The uterus is taken out through the vagina.  Three or four cuts are made in the belly. A surgical device that is controlled by a computer makes a visual image. The device helps the surgeon control the surgical tools. The uterus is cut into small pieces. The pieces are taken out through the cuts or through the vagina.  What can I expect after the surgery?  You will be given pain medicine.  You will need help at home for 3-5 days after surgery.  You will need to see your doctor in 2-4 weeks after surgery.  You may get hot flashes, have night sweats, and have trouble sleeping.  You may need to have Pap tests in the future if your surgery was related to cancer. Talk to your doctor. It is still good to have regular exams. This information is not intended to replace advice given to you by your health care provider. Make sure you discuss any questions you have with your health care provider. Document Released: 12/28/2011 Document Revised: 03/12/2016 Document Reviewed: 06/12/2013 Elsevier Interactive Patient Education  2018 [place] Maintenance, Female Adopting a healthy lifestyle and getting preventive care can go a long way to promote health and wellness. Talk with your health care provider about what schedule of regular  examinations is right for you. This is a good chance for you to check in with your provider about disease prevention and staying healthy. In between checkups, there are plenty of things you can do on your own. Experts have done a lot of research about which lifestyle changes and preventive measures are most likely to keep you healthy. Ask your health care provider for more information. Weight and diet Eat a healthy diet  Be sure to include plenty of vegetables, fruits, low-fat dairy products, and lean protein.  Do not eat a lot of foods high in solid fats, added  sugars, or salt.  Get regular exercise. This is one of the most important things you can do for your health. ? Most adults should exercise for at least 150 minutes each week. The exercise should increase your heart rate and make you sweat (moderate-intensity exercise). ? Most adults should also do strengthening exercises at least twice a week. This is in addition to the moderate-intensity exercise.  Maintain a healthy weight  Body mass index (BMI) is a measurement that can be used to identify possible weight problems. It estimates body fat based on height and weight. Your health care provider can help determine your BMI and help you achieve or maintain a healthy weight.  For females 49 years of age and older: ? A BMI below 18.5 is considered underweight. ? A BMI of 18.5 to 24.9 is normal. ? A BMI of 25 to 29.9 is considered overweight. ? A BMI of 30 and above is considered obese.  Watch levels of cholesterol and blood lipids  You should start having your blood tested for lipids and cholesterol at 38 years of age, then have this test every 5 years.  You may need to have your cholesterol levels checked more often if: ? Your lipid or cholesterol levels are high. ? You are older than 38 years of age. ? You are at high risk for heart disease.  Cancer screening Lung Cancer  Lung cancer screening is recommended for adults 76-34 years old who are at high risk for lung cancer because of a history of smoking.  A yearly low-dose CT scan of the lungs is recommended for people who: ? Currently smoke. ? Have quit within the past 15 years. ? Have at least a 30-pack-year history of smoking. A pack year is smoking an average of one pack of cigarettes a day for 1 year.  Yearly screening should continue until it has been 15 years since you quit.  Yearly screening should stop if you develop a health problem that would prevent you from having lung cancer treatment.  Breast Cancer  Practice breast  self-awareness. This means understanding how your breasts normally appear and feel.  It also means doing regular breast self-exams. Let your health care provider know about any changes, no matter how small.  If you are in your 20s or 30s, you should have a clinical breast exam (CBE) by a health care provider every 1-3 years as part of a regular health exam.  If you are 46 or older, have a CBE every year. Also consider having a breast X-ray (mammogram) every year.  If you have a family history of breast cancer, talk to your health care provider about genetic screening.  If you are at high risk for breast cancer, talk to your health care provider about having an MRI and a mammogram every year.  Breast cancer gene (BRCA) assessment is recommended for women who have family  members with BRCA-related cancers. BRCA-related cancers include: ? Breast. ? Ovarian. ? Tubal. ? Peritoneal cancers.  Results of the assessment will determine the need for genetic counseling and BRCA1 and BRCA2 testing.  Cervical Cancer Your health care provider may recommend that you be screened regularly for cancer of the pelvic organs (ovaries, uterus, and vagina). This screening involves a pelvic examination, including checking for microscopic changes to the surface of your cervix (Pap test). You may be encouraged to have this screening done every 3 years, beginning at age 97.  For women ages 40-65, health care providers may recommend pelvic exams and Pap testing every 3 years, or they may recommend the Pap and pelvic exam, combined with testing for human papilloma virus (HPV), every 5 years. Some types of HPV increase your risk of cervical cancer. Testing for HPV may also be done on women of any age with unclear Pap test results.  Other health care providers may not recommend any screening for nonpregnant women who are considered low risk for pelvic cancer and who do not have symptoms. Ask your health care provider if a  screening pelvic exam is right for you.  If you have had past treatment for cervical cancer or a condition that could lead to cancer, you need Pap tests and screening for cancer for at least 20 years after your treatment. If Pap tests have been discontinued, your risk factors (such as having a new sexual partner) need to be reassessed to determine if screening should resume. Some women have medical problems that increase the chance of getting cervical cancer. In these cases, your health care provider may recommend more frequent screening and Pap tests.  Colorectal Cancer  This type of cancer can be detected and often prevented.  Routine colorectal cancer screening usually begins at 37 years of age and continues through 38 years of age.  Your health care provider may recommend screening at an earlier age if you have risk factors for colon cancer.  Your health care provider may also recommend using home test kits to check for hidden blood in the stool.  A small camera at the end of a tube can be used to examine your colon directly (sigmoidoscopy or colonoscopy). This is done to check for the earliest forms of colorectal cancer.  Routine screening usually begins at age 41.  Direct examination of the colon should be repeated every 5-10 years through 38 years of age. However, you may need to be screened more often if early forms of precancerous polyps or small growths are found.  Skin Cancer  Check your skin from head to toe regularly.  Tell your health care provider about any new moles or changes in moles, especially if there is a change in a mole's shape or color.  Also tell your health care provider if you have a mole that is larger than the size of a pencil eraser.  Always use sunscreen. Apply sunscreen liberally and repeatedly throughout the day.  Protect yourself by wearing long sleeves, pants, a wide-brimmed hat, and sunglasses whenever you are outside.  Heart disease, diabetes, and  high blood pressure  High blood pressure causes heart disease and increases the risk of stroke. High blood pressure is more likely to develop in: ? People who have blood pressure in the high end of the normal range (130-139/85-89 mm Hg). ? People who are overweight or obese. ? People who are African American.  If you are 7-36 years of age, have your blood pressure  checked every 3-5 years. If you are 26 years of age or older, have your blood pressure checked every year. You should have your blood pressure measured twice-once when you are at a hospital or clinic, and once when you are not at a hospital or clinic. Record the average of the two measurements. To check your blood pressure when you are not at a hospital or clinic, you can use: ? An automated blood pressure machine at a pharmacy. ? A home blood pressure monitor.  If you are between 16 years and 56 years old, ask your health care provider if you should take aspirin to prevent strokes.  Have regular diabetes screenings. This involves taking a blood sample to check your fasting blood sugar level. ? If you are at a normal weight and have a low risk for diabetes, have this test once every three years after 38 years of age. ? If you are overweight and have a high risk for diabetes, consider being tested at a younger age or more often. Preventing infection Hepatitis B  If you have a higher risk for hepatitis B, you should be screened for this virus. You are considered at high risk for hepatitis B if: ? You were born in a country where hepatitis B is common. Ask your health care provider which countries are considered high risk. ? Your parents were born in a high-risk country, and you have not been immunized against hepatitis B (hepatitis B vaccine). ? You have HIV or AIDS. ? You use needles to inject street drugs. ? You live with someone who has hepatitis B. ? You have had sex with someone who has hepatitis B. ? You get hemodialysis  treatment. ? You take certain medicines for conditions, including cancer, organ transplantation, and autoimmune conditions.  Hepatitis C  Blood testing is recommended for: ? Everyone born from [birth year] through 1966. ? Anyone with known risk factors for hepatitis C.  Sexually transmitted infections (STIs)  You should be screened for sexually transmitted infections (STIs) including gonorrhea and chlamydia if: ? You are sexually active and are younger than 38 years of age. ? You are older than 38 years of age and your health care provider tells you that you are at risk for this type of infection. ? Your sexual activity has changed since you were last screened and you are at an increased risk for chlamydia or gonorrhea. Ask your health care provider if you are at risk.  If you do not have HIV, but are at risk, it may be recommended that you take a prescription medicine daily to prevent HIV infection. This is called pre-exposure prophylaxis (PrEP). You are considered at risk if: ? You are sexually active and do not regularly use condoms or know the HIV status of your partner(s). ? You take drugs by injection. ? You are sexually active with a partner who has HIV.  Talk with your health care provider about whether you are at high risk of being infected with HIV. If you choose to begin PrEP, you should first be tested for HIV. You should then be tested every 3 months for as long as you are taking PrEP. Pregnancy  If you are premenopausal and you may become pregnant, ask your health care provider about preconception counseling.  If you may become pregnant, take 400 to 800 micrograms (mcg) of folic acid every day.  If you want to prevent pregnancy, talk to your health care provider about birth control (contraception). Osteoporosis and menopause  Osteoporosis is a disease in which the bones lose minerals and strength with aging. This can result in serious bone fractures. Your risk for osteoporosis  can be identified using a bone density scan.  If you are 40 years of age or older, or if you are at risk for osteoporosis and fractures, ask your health care provider if you should be screened.  Ask your health care provider whether you should take a calcium or vitamin D supplement to lower your risk for osteoporosis.  Menopause may have certain physical symptoms and risks.  Hormone replacement therapy may reduce some of these symptoms and risks. Talk to your health care provider about whether hormone replacement therapy is right for you. Follow these instructions at home:  Schedule regular health, dental, and eye exams.  Stay current with your immunizations.  Do not use any tobacco products including cigarettes, chewing tobacco, or electronic cigarettes.  If you are pregnant, do not drink alcohol.  If you are breastfeeding, limit how much and how often you drink alcohol.  Limit alcohol intake to no more than 1 drink per day for nonpregnant women. One drink equals 12 ounces of beer, 5 ounces of wine, or 1 ounces of hard liquor.  Do not use street drugs.  Do not share needles.  Ask your health care provider for help if you need support or information about quitting drugs.  Tell your health care provider if you often feel depressed.  Tell your health care provider if you have ever been abused or do not feel safe at home. This information is not intended to replace advice given to you by your health care provider. Make sure you discuss any questions you have with your health care provider. Document Released: 04/20/2011 Document Revised: 03/12/2016 Document Reviewed: 07/09/2015 Elsevier Interactive Patient Education  [employer].

## 2018-09-02 LAB — HEPATITIS B SURFACE ANTIGEN: HEP B S AG: NEGATIVE

## 2018-09-02 LAB — RPR: RPR Ser Ql: NONREACTIVE

## 2018-09-02 LAB — HIV ANTIBODY (ROUTINE TESTING W REFLEX): HIV Screen 4th Generation wRfx: NONREACTIVE

## 2018-09-02 LAB — HSV(HERPES SIMPLEX VRS) I + II AB-IGG
HSV 1 Glycoprotein G Ab, IgG: <0.91 index (ref 0.00–0.90)
HSV 2 IgG, Type Spec: <0.91 index (ref 0.00–0.90)

## 2018-09-02 LAB — HEPATITIS C ANTIBODY

## 2018-09-07 LAB — CYTOLOGY - PAP
CHLAMYDIA, DNA PROBE: NEGATIVE
Diagnosis: UNDETERMINED — AB
HPV: NOT DETECTED
NEISSERIA GONORRHEA: NEGATIVE
TRICH (WINDOWPATH): NEGATIVE

## 2018-10-11 ENCOUNTER — Encounter: Payer: Self-pay | Admitting: Emergency Medicine

## 2018-10-11 ENCOUNTER — Other Ambulatory Visit: Payer: Self-pay

## 2018-10-11 ENCOUNTER — Emergency Department: Payer: BLUE CROSS/BLUE SHIELD

## 2018-10-11 ENCOUNTER — Emergency Department
Admission: EM | Admit: 2018-10-11 | Discharge: 2018-10-11 | Disposition: A | Discharge status: Home / Self Care | Payer: BLUE CROSS/BLUE SHIELD | Attending: Emergency Medicine | Admitting: Emergency Medicine

## 2018-10-11 DIAGNOSIS — J45909 Unspecified asthma, uncomplicated: Secondary | ICD-10-CM | POA: Insufficient documentation

## 2018-10-11 DIAGNOSIS — R05 Cough: Secondary | ICD-10-CM | POA: Diagnosis not present

## 2018-10-11 DIAGNOSIS — J101 Influenza due to other identified influenza virus with other respiratory manifestations: Secondary | ICD-10-CM | POA: Diagnosis not present

## 2018-10-11 DIAGNOSIS — R509 Fever, unspecified: Secondary | ICD-10-CM | POA: Diagnosis not present

## 2018-10-11 DIAGNOSIS — F17210 Nicotine dependence, cigarettes, uncomplicated: Secondary | ICD-10-CM | POA: Diagnosis not present

## 2018-10-11 LAB — INFLUENZA PANEL BY PCR (TYPE A & B)
Influenza A By PCR: POSITIVE — AB
Influenza B By PCR: NEGATIVE

## 2018-10-11 MED ORDER — GUAIFENESIN-CODEINE 100-10 MG/5ML PO SOLN: 5.0000 mL | Freq: Four times a day (QID) | ORAL | 0 refills | Status: DC | PRN

## 2018-10-11 MED ORDER — OSELTAMIVIR PHOSPHATE 75 MG PO CAPS: 75.0000 mg | ORAL_CAPSULE | Freq: Two times a day (BID) | ORAL | 0 refills | Status: AC

## 2018-10-11 NOTE — ED Provider Notes (Signed)
[facility] Emergency Department Provider Note   ____________________________________________   First MD Initiated Contact with Patient 10/11/18 1620     (approximate)  I have reviewed the triage vital signs and the nursing notes.   HISTORY  Chief Complaint Generalized Body Aches  HPI Heidi Hill is a 37 y.o. female Heidi Hill to the ED with complaint of generalized body aches, fever, cough that started yesterday.  Patient states that the fever and body aches have gotten worse today.  She has had minimal relief with over-the-counter medication.  Her pain as an 8 out of 10.   Past Medical History:  Diagnosis Date  . Fibroids   . GERD (gastroesophageal reflux disease)     Patient Active Problem List   Diagnosis Date Noted  . Asthma 09/01/2018  . Dysmenorrhea 08/26/2016  . Tobacco user 08/26/2016  . Cervical dysplasia 12/19/2015  . History of depression 11/09/2013  . Thyroid activity decreased 08/29/2013  . Uterine leiomyoma 08/29/2013    Past Surgical History:  Procedure Laterality Date  . COLPOSCOPY W/ BIOPSY / CURETTAGE  12/2015    Prior to Admission medications   Medication Sig Start Date End Date Taking? Authorizing Provider  guaiFENesin-codeine 100-10 MG/5ML syrup Take 5 mLs by mouth every 6 (six) hours as needed. 10/11/18   Johnn Hai, PA-C  omeprazole (PRILOSEC) 10 MG capsule Take 20 mg by mouth.    [provider]  oseltamivir (TAMIFLU) 75 MG capsule Take 1 capsule (75 mg total) by mouth 2 (two) times daily for 5 days. 10/11/18 10/16/18  Johnn Hai, PA-C    Allergies Patient has no known allergies.  Family History  Problem Relation Age of Onset  . Diabetes Mother     Social History Social History   Tobacco Use  . Smoking status: Current Every Day Smoker    Packs/day: 0.50    Types: Cigarettes  . Smokeless tobacco: Never Used  Substance Use Topics  . Alcohol use: Yes    Alcohol/week: 2.0 standard  drinks    Types: 2 Glasses of wine per week  . Drug use: No    Review of Systems Constitutional: Positive fever/chills Eyes: No visual changes. ENT: No sore throat. Cardiovascular: Denies chest pain. Respiratory: Denies shortness of breath. Gastrointestinal: No abdominal pain.  No nausea, no vomiting.  No diarrhea.  Genitourinary: Negative for dysuria. Musculoskeletal: Positive for body aches. Skin: Negative for rash. Neurological: Negative for headaches, focal weakness or numbness. ____________________________________________   PHYSICAL EXAM:  VITAL SIGNS: ED Triage Vitals  Enc Vitals Group     BP 10/11/18 1546 129/61     Pulse Rate 10/11/18 1546 (!) 114     Resp 10/11/18 1546 19     Temp 10/11/18 1546 99.1 F (37.3 C)     Temp Source 10/11/18 1546 Oral     SpO2 10/11/18 1546 96 %     Weight 10/11/18 1549 200 lb (90.7 kg)     Height 10/11/18 1549 5\' 7"  (1.702 m)     Head Circumference --      Peak Flow --      Pain Score 10/11/18 1549 8     Pain Loc --      Pain Edu? --      Excl. in [place]? --    Constitutional: Alert and oriented. Well appearing and in no acute distress. Eyes: Conjunctivae are normal.  Head: Atraumatic. Nose: Moderate congestion/rhinnorhea. Mouth/Throat: Mucous membranes are moist.  Oropharynx non-erythematous. Neck: No stridor.  Hematological/Lymphatic/Immunilogical: No cervical lymphadenopathy. Cardiovascular: Normal rate, regular rhythm. Grossly normal heart sounds.  Good peripheral circulation. Respiratory: Normal respiratory effort.  No retractions. Lungs CTAB. Musculoskeletal: No lower extremity tenderness nor edema.  No joint effusions. Neurologic:  Normal speech and language. No gross focal neurologic deficits are appreciated. No gait instability. Skin:  Skin is warm, dry and intact. No rash noted. Psychiatric: Mood and affect are normal. Speech and behavior are normal.  ____________________________________________   LABS (all labs  ordered are listed, but only abnormal results are displayed)  Labs Reviewed  INFLUENZA PANEL BY PCR (TYPE A & B) - Abnormal; Notable for the following components:      Result Value   Influenza A By PCR POSITIVE (*)    All other components within normal limits   RADIOLOGY  Official radiology report(s): Dg Chest 2 View  Result Date: 10/11/2018 CLINICAL DATA:  Fever, cough, and malaise beginning yesterday. EXAM: CHEST - 2 VIEW COMPARISON:  None. FINDINGS: The heart size and mediastinal contours are within normal limits. Both lungs are clear. The visualized skeletal structures are unremarkable. IMPRESSION: Negative.  No active cardiopulmonary disease. Electronically Signed   By: Earle Gell M.D.   On: 10/11/2018 16:17    ____________________________________________   PROCEDURES  Procedure(s) performed: None  Procedures  Critical Care performed: No  ____________________________________________   INITIAL IMPRESSION / ASSESSMENT AND PLAN / ED COURSE  As part of my medical decision making, I reviewed the following data within the electronic MEDICAL RECORD NUMBER Notes from prior ED visits and [place] Controlled Substance Database  Patient presents to the ED with sudden onset of generalized body aches, fever and cough.  Patient has been taking over-the-counter medication without any relief.  Chest x-ray was negative.  Patient was positive for influenza A.  Patient and family member was made aware.  Tamiflu and guaifenesin with codeine was sent to [facility] on [address].  Patient was discharged after family member went to pick up the prescription as they were closing early for Christmas Eve.  ____________________________________________   FINAL CLINICAL IMPRESSION(S) / ED DIAGNOSES  Final diagnoses:  Influenza A     ED Discharge Orders         Ordered    oseltamivir (TAMIFLU) 75 MG capsule  2 times daily     10/11/18 1648    guaiFENesin-codeine 100-10 MG/5ML syrup  Every 6 hours PRN       10/11/18 1648           Note:  This document was prepared using Dragon voice recognition software and may include unintentional dictation errors.    Johnn Hai, PA-C 10/11/18 1745    Harvest Dark, MD 10/13/18 1429

## 2018-10-11 NOTE — ED Notes (Signed)

## 2018-10-11 NOTE — ED Triage Notes (Signed)
Pt arrived with complaints of generalized body aches, fever, and cough. Pt state cough started yesterday and fever and body aches started today. Pt reports minimal relief with OTC regimen.

## 2018-10-11 NOTE — Discharge Instructions (Signed)
Follow-up with your primary care provider or [facility] acute care if any continued problems.  Increase fluids to stay hydrated.  Tylenol or ibuprofen as needed for fever or body aches.  Begin taking Tamiflu twice a day for the next 5 days.  Robitussin-AC for cough and congestion as needed.  This medication contains a narcotic therefore do not drive or operate machinery while taking this medication.

## 2019-01-12 ENCOUNTER — Telehealth: Payer: BLUE CROSS/BLUE SHIELD | Admitting: Nurse Practitioner

## 2019-01-12 DIAGNOSIS — J45909 Unspecified asthma, uncomplicated: Secondary | ICD-10-CM

## 2019-01-12 MED ORDER — ALBUTEROL SULFATE HFA 108 (90 BASE) MCG/ACT IN AERS: 2.0000 | INHALATION_SPRAY | RESPIRATORY_TRACT | 0 refills | Status: DC | PRN

## 2019-01-12 MED ORDER — AEROCHAMBER PLUS FLO-VU MEDIUM MISC: 1.0000 | Freq: Once | 0 refills | Status: AC

## 2019-01-12 NOTE — Progress Notes (Signed)
E-Visit for Corona Virus Screening  Based on your current symptoms, you may very well have the virus, however your symptoms are mild. Currently, not all patients are being tested. If the symptoms are mild and there is not a known exposure, performing the test is not indicated.  Coronavirus disease 2019 (COVID-19) is a respiratory illness that can spread from person to person. The virus that causes COVID-19 is a new virus that was first identified in the country of [place] but is now found in multiple other countries and has spread to the [place].  Symptoms associated with the virus are mild to severe fever, cough, and shortness of breath. There is currently no vaccine to protect against COVID-19, and there is no specific antiviral treatment for the virus.   To be considered HIGH RISK for Coronavirus (COVID-19), you have to meet the following criteria:  . Traveled to [place], [place], [place], [place] or [place]; or in the [place] to [place], [place], [place], or [place]; and have fever, cough, and shortness of breath within the last 2 weeks of travel OR  . Been in close contact with a person diagnosed with COVID-19 within the last 2 weeks and have fever, cough, and shortness of breath  . IF YOU DO NOT MEET THESE CRITERIA, YOU ARE CONSIDERED LOW RISK FOR COVID-19.   It is vitally important that if you feel that you have an infection such as this virus or any other virus that you stay home and away from places where you may spread it to others.  You should self-quarantine for 14 days if you have symptoms that could potentially be coronavirus and avoid contact with people age 22 and older.   You can use medication such as A prescription inhaler called Albuterol MDI 90 mcg /actuation 2 puffs every 4 hours as needed for shortness of breath, wheezing, cough.  If develop worsening shortness of breath, difficulty breathing or become unable to speak in complete sentences, or a new high fever, I  would recommend you follow in the emergency department.   You may also take acetaminophen (Tylenol) as needed for fever.   Reduce your risk of any infection by using the same precautions used for avoiding the common cold or flu:  Marland Kitchen Wash your hands often with soap and warm water for at least 20 seconds.  If soap and water are not readily available, use an alcohol-based hand sanitizer with at least 60% alcohol.  . If coughing or sneezing, cover your mouth and nose by coughing or sneezing into the elbow areas of your shirt or coat, into a tissue or into your sleeve (not your hands). . Avoid shaking hands with others and consider head nods or verbal greetings only. . Avoid touching your eyes, nose, or mouth with unwashed hands.  . Avoid close contact with people who are sick. . Avoid places or events with large numbers of people in one location, like concerts or sporting events. . Carefully consider travel plans you have or are making. . If you are planning any travel outside or inside the [place], visit the CDC's Travelers' Health webpage for the latest health notices. . If you have some symptoms but not all symptoms, continue to monitor at home and seek medical attention if your symptoms worsen. . If you are having a medical emergency, call 911.  HOME CARE . Only take medications as instructed by your medical team. . Drink plenty of fluids and get plenty of rest. .  A steam or ultrasonic humidifier can help if you have congestion.   GET HELP RIGHT AWAY IF: . You develop worsening fever. . You become short of breath . You cough up blood. . Your symptoms become more severe MAKE SURE YOU   Understand these instructions.  Will watch your condition.  Will get help right away if you are not doing well or get worse.  Your e-visit answers were reviewed by a board certified advanced clinical practitioner to complete your personal care plan.  Depending on the condition, your plan could have included  both over the counter or prescription medications.  If there is a problem please reply once you have received a response from your provider. Your safety is important to [place].  If you have drug allergies check your prescription carefully.    You can use MyChart to ask questions about today's visit, request a non-urgent call back, or ask for a work or school excuse for 24 hours related to this e-Visit. If it has been greater than 24 hours you will need to follow up with your provider, or enter a new e-Visit to address those concerns. You will get an e-mail in the next two days asking about your experience.  I hope that your e-visit has been valuable and will speed your recovery. Thank you for using e-visits.

## 2019-03-09 DIAGNOSIS — G43109 Migraine with aura, not intractable, without status migrainosus: Secondary | ICD-10-CM | POA: Diagnosis not present

## 2019-03-09 DIAGNOSIS — N946 Dysmenorrhea, unspecified: Secondary | ICD-10-CM | POA: Diagnosis not present

## 2019-03-09 DIAGNOSIS — J4540 Moderate persistent asthma, uncomplicated: Secondary | ICD-10-CM | POA: Diagnosis not present

## 2019-03-09 DIAGNOSIS — Z720 Tobacco use: Secondary | ICD-10-CM | POA: Diagnosis not present

## 2019-07-20 ENCOUNTER — Emergency Department: Payer: Self-pay

## 2019-07-20 ENCOUNTER — Other Ambulatory Visit: Payer: Self-pay

## 2019-07-20 ENCOUNTER — Emergency Department
Admission: EM | Admit: 2019-07-20 | Discharge: 2019-07-20 | Disposition: A | Discharge status: Home / Self Care | Payer: Self-pay | Attending: Emergency Medicine | Admitting: Emergency Medicine

## 2019-07-20 DIAGNOSIS — R509 Fever, unspecified: Secondary | ICD-10-CM | POA: Insufficient documentation

## 2019-07-20 DIAGNOSIS — J45909 Unspecified asthma, uncomplicated: Secondary | ICD-10-CM | POA: Insufficient documentation

## 2019-07-20 DIAGNOSIS — R350 Frequency of micturition: Secondary | ICD-10-CM | POA: Insufficient documentation

## 2019-07-20 DIAGNOSIS — N12 Tubulo-interstitial nephritis, not specified as acute or chronic: Secondary | ICD-10-CM | POA: Insufficient documentation

## 2019-07-20 DIAGNOSIS — F17210 Nicotine dependence, cigarettes, uncomplicated: Secondary | ICD-10-CM | POA: Insufficient documentation

## 2019-07-20 DIAGNOSIS — Z79899 Other long term (current) drug therapy: Secondary | ICD-10-CM | POA: Insufficient documentation

## 2019-07-20 LAB — URINALYSIS, COMPLETE (UACMP) WITH MICROSCOPIC
Bilirubin Urine: NEGATIVE
Glucose, UA: NEGATIVE mg/dL
Ketones, ur: 5 mg/dL — AB
Nitrite: NEGATIVE
Protein, ur: 30 mg/dL — AB
Specific Gravity, Urine: 1.013 (ref 1.005–1.030)
WBC, UA: >50 WBC/hpf — ABNORMAL HIGH (ref 0–5)
pH: 5 (ref 5.0–8.0)

## 2019-07-20 LAB — CBC
HCT: 42.9 % (ref 36.0–46.0)
Hemoglobin: 14.2 g/dL (ref 12.0–15.0)
MCH: 30.7 pg (ref 26.0–34.0)
MCHC: 33.1 g/dL (ref 30.0–36.0)
MCV: 92.9 fL (ref 80.0–100.0)
Platelets: 243 10*3/uL (ref 150–400)
RBC: 4.62 MIL/uL (ref 3.87–5.11)
RDW: 14.3 % (ref 11.5–15.5)
WBC: 6.6 10*3/uL (ref 4.0–10.5)
nRBC: 0 % (ref 0.0–0.2)

## 2019-07-20 LAB — COMPREHENSIVE METABOLIC PANEL
ALT: 24 U/L (ref 0–44)
AST: 25 U/L (ref 15–41)
Albumin: 4 g/dL (ref 3.5–5.0)
Alkaline Phosphatase: 46 U/L (ref 38–126)
Anion gap: 11 (ref 5–15)
BUN: 10 mg/dL (ref 6–20)
CO2: 21 mmol/L — ABNORMAL LOW (ref 22–32)
Calcium: 9.3 mg/dL (ref 8.9–10.3)
Chloride: 102 mmol/L (ref 98–111)
Creatinine, Ser: 1.13 mg/dL — ABNORMAL HIGH (ref 0.44–1.00)
GFR calc Af Amer: >60 mL/min (ref >60)
GFR calc non Af Amer: >60 mL/min (ref >60)
Glucose, Bld: 99 mg/dL (ref 70–99)
Potassium: 3.8 mmol/L (ref 3.5–5.1)
Sodium: 134 mmol/L — ABNORMAL LOW (ref 135–145)
Total Bilirubin: 1 mg/dL (ref 0.3–1.2)
Total Protein: 8.5 g/dL — ABNORMAL HIGH (ref 6.5–8.1)

## 2019-07-20 LAB — LIPASE, BLOOD: Lipase: 35 U/L (ref 11–51)

## 2019-07-20 LAB — POC URINE PREG, ED: Preg Test, Ur: NEGATIVE

## 2019-07-20 MED ORDER — SODIUM CHLORIDE 0.9 % IV SOLN
1.0000 g | Freq: Once | INTRAVENOUS | Status: AC
  Administered 2019-07-20: 1 g via INTRAVENOUS
  Filled 2019-07-20: qty 10

## 2019-07-20 MED ORDER — KETOROLAC TROMETHAMINE 30 MG/ML IJ SOLN
30.0000 mg | Freq: Once | INTRAMUSCULAR | Status: AC
  Administered 2019-07-20: 30 mg via INTRAVENOUS
  Filled 2019-07-20: qty 1

## 2019-07-20 MED ORDER — SODIUM CHLORIDE 0.9% FLUSH: 3.0000 mL | Freq: Once | INTRAVENOUS | Status: DC

## 2019-07-20 MED ORDER — ACETAMINOPHEN 325 MG PO TABS
650.0000 mg | ORAL_TABLET | Freq: Once | ORAL | Status: AC
  Administered 2019-07-20: 650 mg via ORAL
  Filled 2019-07-20: qty 2

## 2019-07-20 MED ORDER — KETOROLAC TROMETHAMINE 10 MG PO TABS: 10.0000 mg | ORAL_TABLET | Freq: Four times a day (QID) | ORAL | 0 refills | Status: AC | PRN

## 2019-07-20 MED ORDER — CEPHALEXIN 500 MG PO CAPS: 500.0000 mg | ORAL_CAPSULE | Freq: Three times a day (TID) | ORAL | 0 refills | Status: AC

## 2019-07-20 NOTE — Discharge Instructions (Addendum)
You have been diagnosed with a right-sided kidney infection. You have been given IV antibiotics in the emergency department. He will take Keflex 3 times daily for the next week.  Please take medication until completed. Please take Toradol at home for pain.

## 2019-07-20 NOTE — ED Triage Notes (Addendum)
Pt comes via POV from home with c/o lower right back pain and chills. Pt states this started about 2 weeks ago starting with urinary symptoms. Pt states she took home medication for that. Pt states she had a foul odor and the medication got rid of that.  Pt also states dizziness and SOB. Pt states some diarrhea. Pt states urinary frequency. Pt denies any discharge or odor currently.

## 2019-07-20 NOTE — ED Provider Notes (Signed)
[facility] Emergency Department Provider Note  ____________________________________________  Time seen: Approximately 7:22 PM  I have reviewed the triage vital signs and the nursing notes.   HISTORY  Chief Complaint Back Pain and Fever    HPI Heidi Hill is a 38 y.o. female presents to the emergency department with 8 out of 10 right-sided low back pain with increased urinary frequency that is occurred for approximately 2 weeks.  Patient reports that she developed a low-grade fever and chills today.  Patient denies dysuria or hematuria.  She denies any history of cystitis, pyelonephritis or nephrolithiasis in the past.  She denies history of unprotected sex or concerns for STDs.  No changes in vaginal discharge.  No other alleviating measures have been attempted.        Past Medical History:  Diagnosis Date  . Fibroids   . GERD (gastroesophageal reflux disease)     Patient Active Problem List   Diagnosis Date Noted  . Asthma 09/01/2018  . Dysmenorrhea 08/26/2016  . Tobacco user 08/26/2016  . Cervical dysplasia 12/19/2015  . History of depression 11/09/2013  . Thyroid activity decreased 08/29/2013  . Uterine leiomyoma 08/29/2013    Past Surgical History:  Procedure Laterality Date  . COLPOSCOPY W/ BIOPSY / CURETTAGE  12/2015    Prior to Admission medications   Medication Sig Start Date End Date Taking? Authorizing Provider  albuterol (PROVENTIL HFA;VENTOLIN HFA) 108 (90 Base) MCG/ACT inhaler Inhale 2 puffs into the lungs every 4 (four) hours as needed for up to 7 days for wheezing or shortness of breath. 01/12/19 01/19/19  Kara Dies, NP  cephALEXin (KEFLEX) 500 MG capsule Take 1 capsule (500 mg total) by mouth 3 (three) times daily for 7 days. 07/20/19 07/27/19  Lannie Fields, PA-C  guaiFENesin-codeine 100-10 MG/5ML syrup Take 5 mLs by mouth every 6 (six) hours as needed. 10/11/18   Johnn Hai, PA-C  ketorolac (TORADOL) 10 MG  tablet Take 1 tablet (10 mg total) by mouth every 6 (six) hours as needed for up to 5 days. 07/20/19 07/25/19  Lannie Fields, PA-C  omeprazole (PRILOSEC) 10 MG capsule Take 20 mg by mouth.    [provider]    Allergies Patient has no known allergies.  Family History  Problem Relation Age of Onset  . Diabetes Mother     Social History Social History   Tobacco Use  . Smoking status: Current Every Day Smoker    Packs/day: 0.50    Types: Cigarettes  . Smokeless tobacco: Never Used  Substance Use Topics  . Alcohol use: Yes    Alcohol/week: 2.0 standard drinks    Types: 2 Glasses of wine per week  . Drug use: No     Review of Systems  Constitutional: No fever/chills Eyes: No visual changes. No discharge ENT: No upper respiratory complaints. Cardiovascular: no chest pain. Respiratory: no cough. No SOB. Gastrointestinal: No abdominal pain.  No nausea, no vomiting.  No diarrhea.  No constipation. Genitourinary: Patient has increased urinary frequency.  Musculoskeletal: Negative for musculoskeletal pain. Skin: Negative for rash, abrasions, lacerations, ecchymosis. Neurological: Negative for headaches, focal weakness or numbness.   ____________________________________________   PHYSICAL EXAM:  VITAL SIGNS: ED Triage Vitals [07/20/19 1725]  Enc Vitals Group     BP (!) 144/85     Pulse Rate (!) 110     Resp 20     Temp (!) 100.8 F (38.2 C)     Temp Source Oral  SpO2 100 %     Weight 185 lb (83.9 kg)     Height 5\' 7"  (1.702 m)     Head Circumference      Peak Flow      Pain Score 10     Pain Loc      Pain Edu?      Excl. in [place]?      Constitutional: Alert and oriented. Well appearing and in no acute distress. Eyes: Conjunctivae are normal. PERRL. EOMI. Head: Atraumatic. ENT:      Nose: No congestion/rhinnorhea.      Mouth/Throat: Mucous membranes are moist.  Neck: No stridor.  No cervical spine tenderness to  palpation. Hematological/Lymphatic/Immunilogical: No cervical lymphadenopathy.  Cardiovascular: Normal rate, regular rhythm. Normal S1 and S2.  Good peripheral circulation. Respiratory: Normal respiratory effort without tachypnea or retractions. Lungs CTAB. Good air entry to the bases with no decreased or absent breath sounds. Gastrointestinal: Bowel sounds 4 quadrants.  Patient has suprapubic tenderness to palpation.  No guarding or rigidity. No palpable masses. No distention. No CVA tenderness. Musculoskeletal: Full range of motion to all extremities. No gross deformities appreciated. Neurologic:  Normal speech and language. No gross focal neurologic deficits are appreciated.  Skin:  Skin is warm, dry and intact. No rash noted. Psychiatric: Mood and affect are normal. Speech and behavior are normal. Patient exhibits appropriate insight and judgement.   ____________________________________________   LABS (all labs ordered are listed, but only abnormal results are displayed)  Labs Reviewed  COMPREHENSIVE METABOLIC PANEL - Abnormal; Notable for the following components:      Result Value   Sodium 134 (*)    CO2 21 (*)    Creatinine, Ser 1.13 (*)    Total Protein 8.5 (*)    All other components within normal limits  URINALYSIS, COMPLETE (UACMP) WITH MICROSCOPIC - Abnormal; Notable for the following components:   Color, Urine YELLOW (*)    APPearance CLOUDY (*)    Hgb urine dipstick MODERATE (*)    Ketones, ur 5 (*)    Protein, ur 30 (*)    Leukocytes,Ua MODERATE (*)    WBC, UA >50 (*)    Bacteria, UA RARE (*)    All other components within normal limits  LIPASE, BLOOD  CBC  POC URINE PREG, ED   ____________________________________________  EKG   ____________________________________________  RADIOLOGY I personally viewed and evaluated these images as part of my medical decision making, as well as reviewing the written report by the radiologist.  Dg Chest 2 View  Result  Date: 07/20/2019 CLINICAL DATA:  38 year old female with history of shortness of breath. EXAM: CHEST - 2 VIEW COMPARISON:  Chest x-ray 10/11/2018. FINDINGS: Lung volumes are normal. No consolidative airspace disease. No pleural effusions. No pneumothorax. No pulmonary nodule or mass noted. Pulmonary vasculature and the cardiomediastinal silhouette are within normal limits. IMPRESSION: No radiographic evidence of acute cardiopulmonary disease. Electronically Signed   By: Vinnie Langton M.D.   On: 07/20/2019 18:58   Ct Renal Stone Study  Result Date: 07/20/2019 CLINICAL DATA:  Lower right back pain and chills, began 2 weeks ago with urinary symptoms EXAM: CT ABDOMEN AND PELVIS WITHOUT CONTRAST TECHNIQUE: Multidetector CT imaging of the abdomen and pelvis was performed following the standard protocol without IV contrast. COMPARISON:  Pelvic ultrasound 09/02/2016 FINDINGS: Lower chest: Bandlike opacity in the left base and right middle lobe compatible with atelectasis and/or scarring. Lung bases otherwise clear. Normal heart size. No pericardial effusion. Hepatobiliary: No focal  liver abnormality is seen. Small amount of gradient density within the a partially decompressed gallbladder likely reflecting biliary sludge. No calcified gallstones, gallbladder wall thickening, or biliary dilatation. Pancreas: Unremarkable. No pancreatic ductal dilatation or surrounding inflammatory changes. Spleen: Normal in size without focal abnormality. Adrenals/Urinary Tract: Normal adrenal glands. Asymmetric enlargement and right perinephric stranding with additional right ureterectasis and urothelial thickening. No visible urolithiasis. No frank hydronephrosis. Left kidney has a more normal appearance. There is mild circumferential thickening of the urinary bladder with perivesicular hazy stranding. Stomach/Bowel: Distal esophagus, stomach and duodenal sweep are unremarkable. No small bowel wall thickening or dilatation. No  evidence of obstruction. A normal appendix is visualized. No colonic dilatation or wall thickening. Vascular/Lymphatic: The aorta is normal caliber. No suspicious or enlarged lymph nodes in the included lymphatic chains. Reproductive: Anteverted uterus with several bulky, partially calcified intramural fibroids both anteriorly and posteriorly. These are incompletely characterized in the absence of contrast media. No concerning adnexal lesions. Other: No abdominopelvic free fluid or free gas. No bowel containing hernias. Musculoskeletal: No acute osseous abnormality or suspicious osseous lesion. Partially calcified disc bulge at L5-S1 with mild canal stenosis. IMPRESSION: 1. Asymmetric enlargement and right perinephric stranding with additional right ureterectasis and urothelial thickening, suggestive of ascending urinary tract infection and features of cystitis. Correlate with urinalysis. 2. No visible urolithiasis or hydronephrosis. 3. Multiple bulky, partially calcified intramural fibroids within the uterus. These are incompletely characterized in the absence of contrast media. Consider further evaluation with pelvic ultrasound as clinically indicated. 4. Partially calcified global disc bulge at L5-S1 with mild canal stenosis. Electronically Signed   By: Lovena Le M.D.   On: 07/20/2019 20:09    ____________________________________________    PROCEDURES  Procedure(s) performed:    Procedures    Medications  sodium chloride flush (NS) 0.9 % injection 3 mL (has no administration in time range)  ketorolac (TORADOL) 30 MG/ML injection 30 mg (has no administration in time range)  acetaminophen (TYLENOL) tablet 650 mg (650 mg Oral Given 07/20/19 1741)  cefTRIAXone (ROCEPHIN) 1 g in sodium chloride 0.9 % 100 mL IVPB (0 g Intravenous Stopped 07/20/19 2032)     ____________________________________________   INITIAL IMPRESSION / ASSESSMENT AND PLAN / ED COURSE  Pertinent labs & imaging results  that were available during my care of the patient were reviewed by me and considered in my medical decision making (see chart for details).  Review of the [place] CSRS was performed in accordance of the [place] prior to dispensing any controlled drugs.         Assessment and Plan:  Pyelonephritis 38 year old female presents to the emergency department with worsening right-sided low back pain, fever and chills.  She has had increased urinary frequency for the past 2 weeks.  On physical exam, patient had low-grade fever and was mildly tachycardic.  She seemed uncomfortable.  She had right-sided CVA tenderness and some suprapubic tenderness to palpation.  Urinalysis was concerning for cystitis with moderate leuks, bacteria and a moderate amount of blood.  CT renal stone study was obtained which suggested a sending infection without renal stone.  Patient was given IV Rocephin in the emergency department and was discharged with Keflex.  She was given IV Toradol for pain.  She was discharged with a 5-day supply of Toradol.  Patient was cautioned to return to the emergency department with worsening pain, fever or vomiting.  She voiced understanding and has easy access to the ED should her symptoms worsen.   ____________________________________________  FINAL CLINICAL IMPRESSION(S) / ED DIAGNOSES  Final diagnoses:  Pyelonephritis      NEW MEDICATIONS STARTED DURING THIS VISIT:  ED Discharge Orders         Ordered    cephALEXin (KEFLEX) 500 MG capsule  3 times daily     07/20/19 2021    ketorolac (TORADOL) 10 MG tablet  Every 6 hours PRN     07/20/19 2022              This chart was dictated using voice recognition software/Dragon. Despite best efforts to proofread, errors can occur which can change the meaning. Any change was purely unintentional.    Lannie Fields, PA-C 07/20/19 2036    Duffy Bruce, MD 07/23/19 1234

## 2019-12-18 ENCOUNTER — Emergency Department: Payer: BC Managed Care – PPO

## 2019-12-18 ENCOUNTER — Emergency Department
Admission: EM | Admit: 2019-12-18 | Discharge: 2019-12-18 | Disposition: A | Discharge status: Home / Self Care | Payer: BC Managed Care – PPO | Attending: Student | Admitting: Student

## 2019-12-18 ENCOUNTER — Encounter: Payer: Self-pay | Admitting: Emergency Medicine

## 2019-12-18 ENCOUNTER — Other Ambulatory Visit: Payer: Self-pay

## 2019-12-18 DIAGNOSIS — R059 Cough, unspecified: Secondary | ICD-10-CM

## 2019-12-18 DIAGNOSIS — F17210 Nicotine dependence, cigarettes, uncomplicated: Secondary | ICD-10-CM | POA: Diagnosis not present

## 2019-12-18 DIAGNOSIS — R05 Cough: Secondary | ICD-10-CM

## 2019-12-18 DIAGNOSIS — R509 Fever, unspecified: Secondary | ICD-10-CM | POA: Diagnosis present

## 2019-12-18 DIAGNOSIS — J45909 Unspecified asthma, uncomplicated: Secondary | ICD-10-CM | POA: Diagnosis not present

## 2019-12-18 DIAGNOSIS — Z20822 Contact with and (suspected) exposure to covid-19: Secondary | ICD-10-CM | POA: Insufficient documentation

## 2019-12-18 DIAGNOSIS — J189 Pneumonia, unspecified organism: Secondary | ICD-10-CM

## 2019-12-18 LAB — SARS CORONAVIRUS 2 (TAT 6-24 HRS): SARS Coronavirus 2: NEGATIVE

## 2019-12-18 MED ORDER — IBUPROFEN 600 MG PO TABS
600.0000 mg | ORAL_TABLET | Freq: Once | ORAL | Status: AC
  Administered 2019-12-18: 600 mg via ORAL
  Filled 2019-12-18: qty 1

## 2019-12-18 MED ORDER — FEXOFENADINE-PSEUDOEPHED ER 60-120 MG PO TB12: 1.0000 | ORAL_TABLET | Freq: Two times a day (BID) | ORAL | 0 refills | Status: DC

## 2019-12-18 MED ORDER — AZITHROMYCIN 250 MG PO TABS: ORAL_TABLET | ORAL | 0 refills | Status: AC

## 2019-12-18 MED ORDER — IBUPROFEN 600 MG PO TABS: 600.0000 mg | ORAL_TABLET | Freq: Three times a day (TID) | ORAL | 0 refills | Status: DC | PRN

## 2019-12-18 MED ORDER — BENZONATATE 100 MG PO CAPS: 200.0000 mg | ORAL_CAPSULE | Freq: Three times a day (TID) | ORAL | 0 refills | Status: DC | PRN

## 2019-12-18 NOTE — ED Triage Notes (Addendum)
Patient presents to the ED with fever yesterday and body aches.  Patient states today her fever has been better but she has had episodes of diaphoresis and body aches continues.  Denies vomiting and diarrhea Patient has some nasal congestion and a dry cough.

## 2019-12-18 NOTE — Discharge Instructions (Addendum)
Follow discharge care instruction take medication as directed.  Advised self quarantine pending results of COVID-19 test.  If test is positive must quarantine for additional 10 days. °

## 2019-12-18 NOTE — ED Notes (Addendum)
See triage note  Presents with some body aches  States she had fever at home yesterday  Afebrile on arrival  Also having some nasal congestion

## 2019-12-18 NOTE — ED Provider Notes (Signed)
[facility] Emergency Department Provider Note   ____________________________________________   First MD Initiated Contact with Patient 12/18/19 1140     (approximate)  I have reviewed the triage vital signs and the nursing notes.   HISTORY  Chief Complaint Generalized Body Aches and Fever    HPI Heidi Hill is a 39 y.o. female patient presents with onset of fever, body aches, nasal congestion, and cough.  Patient denies nausea, vomiting, diarrhea.  Patient the cough is nonproductive.  No palliative measure for complaint.  Patient denies recent travel or known contact with COVID-19.         Past Medical History:  Diagnosis Date  . Fibroids   . GERD (gastroesophageal reflux disease)     Patient Active Problem List   Diagnosis Date Noted  . Asthma 09/01/2018  . Dysmenorrhea 08/26/2016  . Tobacco user 08/26/2016  . Cervical dysplasia 12/19/2015  . History of depression 11/09/2013  . Thyroid activity decreased 08/29/2013  . Uterine leiomyoma 08/29/2013    Past Surgical History:  Procedure Laterality Date  . COLPOSCOPY W/ BIOPSY / CURETTAGE  12/2015    Prior to Admission medications   Medication Sig Start Date End Date Taking? Authorizing Provider  albuterol (PROVENTIL HFA;VENTOLIN HFA) 108 (90 Base) MCG/ACT inhaler Inhale 2 puffs into the lungs every 4 (four) hours as needed for up to 7 days for wheezing or shortness of breath. 01/12/19 01/19/19  Kara Dies, NP  azithromycin (ZITHROMAX Z-PAK) 250 MG tablet Take 2 tablets (500 mg) on  Day 1,  followed by 1 tablet (250 mg) once daily on Days 2 through 5. 12/18/19 12/23/19  Sable Feil, PA-C  benzonatate (TESSALON PERLES) 100 MG capsule Take 2 capsules (200 mg total) by mouth 3 (three) times daily as needed. 12/18/19 12/17/20  Sable Feil, PA-C  fexofenadine-pseudoephedrine (ALLEGRA-D) 60-120 MG 12 hr tablet Take 1 tablet by mouth 2 (two) times daily. 12/18/19   Sable Feil, PA-C    ibuprofen (ADVIL) 600 MG tablet Take 1 tablet (600 mg total) by mouth every 8 (eight) hours as needed. 12/18/19   Sable Feil, PA-C  omeprazole (PRILOSEC) 10 MG capsule Take 20 mg by mouth.    [provider]    Allergies Patient has no known allergies.  Family History  Problem Relation Age of Onset  . Diabetes Mother     Social History Social History   Tobacco Use  . Smoking status: Current Every Day Smoker    Packs/day: 0.25    Types: Cigarettes  . Smokeless tobacco: Never Used  Substance Use Topics  . Alcohol use: Yes    Alcohol/week: 2.0 standard drinks    Types: 2 Glasses of wine per week    Comment: "i just drink on the weekends"  . Drug use: No    Review of Systems Constitutional: No fever/chills Eyes: No visual changes. ENT: No sore throat.  Nasal congestion. Cardiovascular: Denies chest pain. Respiratory: Denies shortness of breath.  Nonproductive cough. Gastrointestinal: No abdominal pain.  No nausea, no vomiting.  No diarrhea.  No constipation. Genitourinary: Negative for dysuria. Musculoskeletal: Negative for back pain. Skin: Negative for rash. Neurological: Negative for headaches, focal weakness or numbness.   ____________________________________________   PHYSICAL EXAM:  VITAL SIGNS: ED Triage Vitals  Enc Vitals Group     BP 12/18/19 1121 130/67     Pulse Rate 12/18/19 1121 94     Resp 12/18/19 1121 16     Temp 12/18/19  1121 98.8 F (37.1 C)     Temp Source 12/18/19 1121 Oral     SpO2 12/18/19 1121 97 %     Weight 12/18/19 1123 190 lb (86.2 kg)     Height 12/18/19 1123 5\' 7"  (1.702 m)     Head Circumference --      Peak Flow --      Pain Score 12/18/19 1122 0     Pain Loc --      Pain Edu? --      Excl. in [place]? --    Constitutional: Alert and oriented. Well appearing and in no acute distress. Nose: Edematous nasal turbinates with clear rhinorrhea. Mouth/Throat: Mucous membranes are moist.  Oropharynx non-erythematous.   Postnasal drainage. Neck: No stridor.  Hematological/Lymphatic/Immunilogical: No cervical lymphadenopathy. Cardiovascular: Normal rate, regular rhythm. Grossly normal heart sounds.  Good peripheral circulation. Respiratory: Normal respiratory effort.  No retractions. Lungs CTAB. Gastrointestinal: Soft and nontender. No distention. No abdominal bruits. No CVA tenderness. Genitourinary: Deferred Skin:  Skin is warm, dry and intact. No rash noted. Psychiatric: Mood and affect are normal. Speech and behavior are normal.  ____________________________________________   LABS (all labs ordered are listed, but only abnormal results are displayed)  Labs Reviewed  SARS CORONAVIRUS 2 (TAT 6-24 HRS)   ____________________________________________  EKG   ____________________________________________  RADIOLOGY  ED MD interpretation:    Official radiology report(s): DG Chest Port 1 View  Result Date: 12/18/2019 CLINICAL DATA:  Fever yesterday, body aches, diaphoresis, nasal congestion and dry cough, smoker EXAM: PORTABLE CHEST 1 VIEW COMPARISON:  Portable exam 1212 hours compared to 07/20/2019 FINDINGS: Normal heart size, mediastinal contours, and pulmonary vascularity. Slightly increased peribronchial thickening. Increased markings at RIGHT lower lobe could represent subtle developing infiltrate. Remaining lungs clear. No pleural effusion or pneumothorax. Osseous structures unremarkable. IMPRESSION: Bronchitic changes with question subtle RIGHT lower lobe infiltrate. Electronically Signed   By: Lavonia Dana M.D.   On: 12/18/2019 12:48    ____________________________________________   PROCEDURES  Procedure(s) performed (including Critical Care):  Procedures   ____________________________________________   INITIAL IMPRESSION / ASSESSMENT AND PLAN / ED COURSE  As part of my medical decision making, I reviewed the following data within the [place]     Patient  presented cute onset of intimating fever, body aches, nasal congestion, nonproductive cough.  Discussed x-ray findings with patient consistent with early right lower lobe pneumonia.  Patient given discharge care instruction advised take medication as directed.  Patient advised to self quarantine pending results of COVID-19 test.    Heidi Hill was evaluated in Emergency Department on 12/18/2019 for the symptoms described in the history of present illness. She was evaluated in the context of the global COVID-19 pandemic, which necessitated consideration that the patient might be at risk for infection with the SARS-CoV-2 virus that causes COVID-19. Institutional protocols and algorithms that pertain to the evaluation of patients at risk for COVID-19 are in a state of rapid change based on information released by regulatory bodies including the CDC and federal and state organizations. These policies and algorithms were followed during the patient's care in the ED.       ____________________________________________   FINAL CLINICAL IMPRESSION(S) / ED DIAGNOSES  Final diagnoses:  Community acquired pneumonia of right lower lobe of lung     ED Discharge Orders         Ordered    azithromycin (ZITHROMAX Z-PAK) 250 MG tablet     12/18/19 1256  fexofenadine-pseudoephedrine (ALLEGRA-D) 60-120 MG 12 hr tablet  2 times daily     12/18/19 1256    benzonatate (TESSALON PERLES) 100 MG capsule  3 times daily PRN     12/18/19 1256    ibuprofen (ADVIL) 600 MG tablet  Every 8 hours PRN     12/18/19 1256           Note:  This document was prepared using Dragon voice recognition software and may include unintentional dictation errors.    Sable Feil, PA-C 12/18/19 1302    Lilia Pro., MD 12/18/19 Drema Halon

## 2020-02-02 ENCOUNTER — Encounter: Payer: Self-pay | Admitting: Obstetrics and Gynecology

## 2020-03-21 ENCOUNTER — Other Ambulatory Visit: Payer: Self-pay

## 2020-03-21 ENCOUNTER — Emergency Department: Payer: BC Managed Care – PPO

## 2020-03-21 ENCOUNTER — Encounter: Payer: Self-pay | Admitting: *Deleted

## 2020-03-21 ENCOUNTER — Inpatient Hospital Stay
Admission: EM | Admit: 2020-03-21 | Discharge: 2020-03-22 | DRG: 153 | Disposition: A | Discharge status: Home / Self Care | Payer: BC Managed Care – PPO | Attending: Internal Medicine | Admitting: Internal Medicine

## 2020-03-21 DIAGNOSIS — Z20822 Contact with and (suspected) exposure to covid-19: Secondary | ICD-10-CM | POA: Diagnosis present

## 2020-03-21 DIAGNOSIS — Z833 Family history of diabetes mellitus: Secondary | ICD-10-CM | POA: Diagnosis not present

## 2020-03-21 DIAGNOSIS — F17210 Nicotine dependence, cigarettes, uncomplicated: Secondary | ICD-10-CM | POA: Diagnosis present

## 2020-03-21 DIAGNOSIS — K219 Gastro-esophageal reflux disease without esophagitis: Secondary | ICD-10-CM | POA: Diagnosis present

## 2020-03-21 DIAGNOSIS — R739 Hyperglycemia, unspecified: Secondary | ICD-10-CM | POA: Diagnosis present

## 2020-03-21 DIAGNOSIS — R061 Stridor: Secondary | ICD-10-CM | POA: Diagnosis present

## 2020-03-21 DIAGNOSIS — J039 Acute tonsillitis, unspecified: Secondary | ICD-10-CM | POA: Diagnosis present

## 2020-03-21 DIAGNOSIS — J0510 Acute epiglottitis without obstruction: Secondary | ICD-10-CM | POA: Diagnosis present

## 2020-03-21 DIAGNOSIS — N179 Acute kidney failure, unspecified: Secondary | ICD-10-CM

## 2020-03-21 LAB — CBC WITH DIFFERENTIAL/PLATELET
Abs Immature Granulocytes: 0.01 10*3/uL (ref 0.00–0.07)
Basophils Absolute: 0.1 10*3/uL (ref 0.0–0.1)
Basophils Relative: 1 %
Eosinophils Absolute: 0.2 10*3/uL (ref 0.0–0.5)
Eosinophils Relative: 4 %
HCT: 41.7 % (ref 36.0–46.0)
Hemoglobin: 13.6 g/dL (ref 12.0–15.0)
Immature Granulocytes: 0 %
Lymphocytes Relative: 41 %
Lymphs Abs: 2.3 10*3/uL (ref 0.7–4.0)
MCH: 31.3 pg (ref 26.0–34.0)
MCHC: 32.6 g/dL (ref 30.0–36.0)
MCV: 95.9 fL (ref 80.0–100.0)
Monocytes Absolute: 0.6 10*3/uL (ref 0.1–1.0)
Monocytes Relative: 11 %
Neutro Abs: 2.4 10*3/uL (ref 1.7–7.7)
Neutrophils Relative %: 43 %
Platelets: 251 10*3/uL (ref 150–400)
RBC: 4.35 MIL/uL (ref 3.87–5.11)
RDW: 13.9 % (ref 11.5–15.5)
WBC: 5.5 10*3/uL (ref 4.0–10.5)
nRBC: 0 % (ref 0.0–0.2)

## 2020-03-21 LAB — COMPREHENSIVE METABOLIC PANEL
ALT: 26 U/L (ref 0–44)
AST: 22 U/L (ref 15–41)
Albumin: 3.4 g/dL — ABNORMAL LOW (ref 3.5–5.0)
Alkaline Phosphatase: 39 U/L (ref 38–126)
Anion gap: 8 (ref 5–15)
BUN: 20 mg/dL (ref 6–20)
CO2: 24 mmol/L (ref 22–32)
Calcium: 8.8 mg/dL — ABNORMAL LOW (ref 8.9–10.3)
Chloride: 106 mmol/L (ref 98–111)
Creatinine, Ser: 1.15 mg/dL — ABNORMAL HIGH (ref 0.44–1.00)
GFR calc Af Amer: >60 mL/min (ref >60)
GFR calc non Af Amer: 59 mL/min — ABNORMAL LOW (ref >60)
Glucose, Bld: 116 mg/dL — ABNORMAL HIGH (ref 70–99)
Potassium: 3.9 mmol/L (ref 3.5–5.1)
Sodium: 138 mmol/L (ref 135–145)
Total Bilirubin: 0.6 mg/dL (ref 0.3–1.2)
Total Protein: 7.5 g/dL (ref 6.5–8.1)

## 2020-03-21 LAB — HIV ANTIBODY (ROUTINE TESTING W REFLEX): HIV Screen 4th Generation wRfx: NONREACTIVE

## 2020-03-21 LAB — GROUP A STREP BY PCR: Group A Strep by PCR: NOT DETECTED

## 2020-03-21 MED ORDER — ACETAMINOPHEN 650 MG RE SUPP: 650.0000 mg | Freq: Four times a day (QID) | RECTAL | Status: DC | PRN

## 2020-03-21 MED ORDER — SODIUM CHLORIDE 0.9 % IV SOLN
25.0000 mg | Freq: Four times a day (QID) | INTRAVENOUS | Status: DC
  Filled 2020-03-21 (×4): qty 0.5

## 2020-03-21 MED ORDER — DEXAMETHASONE SODIUM PHOSPHATE 10 MG/ML IJ SOLN
6.0000 mg | INTRAMUSCULAR | Status: DC
  Filled 2020-03-21: qty 1

## 2020-03-21 MED ORDER — TRAZODONE HCL 50 MG PO TABS: 25.0000 mg | ORAL_TABLET | Freq: Every evening | ORAL | Status: DC | PRN

## 2020-03-21 MED ORDER — MAGNESIUM HYDROXIDE 400 MG/5ML PO SUSP
30.0000 mL | Freq: Every day | ORAL | Status: DC | PRN
  Administered 2020-03-21: 30 mL via ORAL
  Filled 2020-03-21: qty 30

## 2020-03-21 MED ORDER — SODIUM CHLORIDE 0.9 % IV SOLN
2.0000 g | Freq: Once | INTRAVENOUS | Status: AC
  Administered 2020-03-21: 2 g via INTRAVENOUS
  Filled 2020-03-21: qty 20

## 2020-03-21 MED ORDER — MORPHINE SULFATE (PF) 2 MG/ML IV SOLN
INTRAVENOUS | Status: AC
  Filled 2020-03-21: qty 1

## 2020-03-21 MED ORDER — ACETAMINOPHEN 325 MG PO TABS
650.0000 mg | ORAL_TABLET | Freq: Four times a day (QID) | ORAL | Status: DC | PRN
  Administered 2020-03-21 – 2020-03-22 (×3): 650 mg via ORAL
  Filled 2020-03-21 (×3): qty 2

## 2020-03-21 MED ORDER — ENOXAPARIN SODIUM 40 MG/0.4ML ~~LOC~~ SOLN
40.0000 mg | SUBCUTANEOUS | Status: DC
  Filled 2020-03-21 (×2): qty 0.4

## 2020-03-21 MED ORDER — MORPHINE SULFATE (PF) 2 MG/ML IV SOLN
2.0000 mg | Freq: Once | INTRAVENOUS | Status: AC
  Administered 2020-03-21: 2 mg via INTRAVENOUS
  Filled 2020-03-21: qty 1

## 2020-03-21 MED ORDER — DEXAMETHASONE SODIUM PHOSPHATE 10 MG/ML IJ SOLN
10.0000 mg | Freq: Once | INTRAMUSCULAR | Status: AC
  Administered 2020-03-21: 10 mg via INTRAVENOUS
  Filled 2020-03-21: qty 1

## 2020-03-21 MED ORDER — FAMOTIDINE IN NACL 20-0.9 MG/50ML-% IV SOLN
20.0000 mg | Freq: Two times a day (BID) | INTRAVENOUS | Status: DC
  Administered 2020-03-21 – 2020-03-22 (×3): 20 mg via INTRAVENOUS
  Filled 2020-03-21 (×3): qty 50

## 2020-03-21 MED ORDER — ONDANSETRON HCL 4 MG PO TABS: 4.0000 mg | ORAL_TABLET | Freq: Four times a day (QID) | ORAL | Status: DC | PRN

## 2020-03-21 MED ORDER — SODIUM CHLORIDE 0.9 % IV SOLN
2.0000 g | INTRAVENOUS | Status: DC
  Administered 2020-03-22: 2 g via INTRAVENOUS
  Filled 2020-03-21: qty 2

## 2020-03-21 MED ORDER — METHYLPREDNISOLONE SODIUM SUCC 125 MG IJ SOLR
125.0000 mg | Freq: Once | INTRAMUSCULAR | Status: AC
  Administered 2020-03-21: 125 mg via INTRAVENOUS
  Filled 2020-03-21: qty 2

## 2020-03-21 MED ORDER — DIPHENHYDRAMINE HCL 50 MG/ML IJ SOLN
25.0000 mg | Freq: Once | INTRAMUSCULAR | Status: AC
  Administered 2020-03-21: 25 mg via INTRAVENOUS
  Filled 2020-03-21: qty 1

## 2020-03-21 MED ORDER — SODIUM CHLORIDE 0.9 % IV SOLN: INTRAVENOUS | Status: DC

## 2020-03-21 MED ORDER — EPINEPHRINE 0.3 MG/0.3ML IJ SOAJ
INTRAMUSCULAR | Status: AC
  Filled 2020-03-21: qty 0.3

## 2020-03-21 MED ORDER — ONDANSETRON HCL 4 MG/2ML IJ SOLN: 4.0000 mg | Freq: Four times a day (QID) | INTRAMUSCULAR | Status: DC | PRN

## 2020-03-21 MED ORDER — IOHEXOL 300 MG/ML  SOLN
75.0000 mL | Freq: Once | INTRAMUSCULAR | Status: AC | PRN
  Administered 2020-03-21: 75 mL via INTRAVENOUS

## 2020-03-21 MED ORDER — DIPHENHYDRAMINE HCL 50 MG/ML IJ SOLN
25.0000 mg | Freq: Four times a day (QID) | INTRAMUSCULAR | Status: DC
  Administered 2020-03-21 – 2020-03-22 (×5): 25 mg via INTRAVENOUS
  Filled 2020-03-21 (×5): qty 1

## 2020-03-21 MED ORDER — FAMOTIDINE IN NACL 20-0.9 MG/50ML-% IV SOLN
20.0000 mg | Freq: Once | INTRAVENOUS | Status: AC
  Administered 2020-03-21: 20 mg via INTRAVENOUS
  Filled 2020-03-21: qty 50

## 2020-03-21 NOTE — Consult Note (Signed)
Heidi Hill, Heidi Hill 04-24-1981 Heidi Nearing, MD  Reason for Consult: Sore throat, possible epiglottitis Requesting Physician: Gregor Hams, MD Consulting Physician: Heidi Hill  HPI: This 39 y.o. year old female was admitted on 03/21/2020 for feels like throat closing. Had sudden onset of sore throat, probable fever over the past 24 hours. Came to hospital when it seemed hard to breathe. Apparently initially had some stridor on arrival but that has resolved and she says she is no longer having trouble breathing since she was given meds. No h/o recurrent tonsillitis and no one at home has been sick.  Allergies: No Known Allergies  Medications: (Not in a hospital admission) .  Current Facility-Administered Medications  Medication Dose Route Frequency Provider Last Rate Last Admin  . cefTRIAXone (ROCEPHIN) 2 g in sodium chloride 0.9 % 100 mL IVPB  2 g Intravenous Once Gregor Hams, MD 200 mL/hr at 03/21/20 0425 2 g at 03/21/20 0425  . EPINEPHrine (EPI-PEN) 0.3 mg/0.3 mL injection            Current Outpatient Medications  Medication Sig Dispense Refill  . albuterol (PROVENTIL HFA;VENTOLIN HFA) 108 (90 Base) MCG/ACT inhaler Inhale 2 puffs into the lungs every 4 (four) hours as needed for up to 7 days for wheezing or shortness of breath. 1 Inhaler 0  . benzonatate (TESSALON PERLES) 100 MG capsule Take 2 capsules (200 mg total) by mouth 3 (three) times daily as needed. 30 capsule 0  . fexofenadine-pseudoephedrine (ALLEGRA-D) 60-120 MG 12 hr tablet Take 1 tablet by mouth 2 (two) times daily. 20 tablet 0  . ibuprofen (ADVIL) 600 MG tablet Take 1 tablet (600 mg total) by mouth every 8 (eight) hours as needed. 15 tablet 0  . omeprazole (PRILOSEC) 10 MG capsule Take 20 mg by mouth.      PMH:  Past Medical History:  Diagnosis Date  . Fibroids   . GERD (gastroesophageal reflux disease)     Fam Hx:  Family History  Problem Relation Age of Onset  . Diabetes Mother      Soc Hx:  Social History   Socioeconomic History  . Marital status: Single    Spouse name: Not on file  . Number of children: Not on file  . Years of education: Not on file  . Highest education level: Not on file  Occupational History  . Not on file  Tobacco Use  . Smoking status: Current Every Day Smoker    Packs/day: 0.25    Types: Cigarettes  . Smokeless tobacco: Never Used  Substance and Sexual Activity  . Alcohol use: Yes    Alcohol/week: 2.0 standard drinks    Types: 2 Glasses of wine per week    Comment: "i just drink on the weekends"  . Drug use: No  . Sexual activity: Not Currently  Other Topics Concern  . Not on file  Social History Narrative  . Not on file   Social Determinants of Health   Financial Resource Strain:   . Difficulty of Paying Living Expenses:   Food Insecurity:   . Worried About Charity fundraiser in the Last Year:   . Arboriculturist in the Last Year:   Transportation Needs:   . Film/video editor (Medical):   Marland Kitchen Lack of Transportation (Non-Medical):   Physical Activity:   . Days of Exercise per Week:   . Minutes of Exercise per Session:   Stress:   . Feeling of Stress :  Social Connections:   . Frequency of Communication with Friends and Family:   . Frequency of Social Gatherings with Friends and Family:   . Attends Religious Services:   . Active Member of Clubs or Organizations:   . Attends Archivist Meetings:   Marland Kitchen Marital Status:   Intimate Partner Violence:   . Fear of Current or Ex-Partner:   . Emotionally Abused:   Marland Kitchen Physically Abused:   . Sexually Abused:     PSH:  Past Surgical History:  Procedure Laterality Date  . COLPOSCOPY W/ BIOPSY / CURETTAGE  12/2015  . Procedures since admission: No admission procedures for hospital encounter.  ROS: Review of systems normal other than 12 systems except per HPI.  PHYSICAL EXAM Vitals:  Vitals:   03/21/20 0217  BP: (!) 147/93  Pulse: (!) 105  Resp: 20   Temp: 99 F (37.2 C)  SpO2: 96%  . General: Well-developed, Well-nourished in no acute distress Mood: Mood and affect well adjusted, pleasant and cooperative. Orientation: Grossly alert and oriented. Vocal Quality: No hoarseness. Communicates verbally. Slight mufflling. No stridor head and Face: NCAT. No facial asymmetry. No visible skin lesions. No significant facial scars. No tenderness with sinus percussion. Facial strength normal and symmetric. Ears: External ears with normal landmarks, no lesions. External auditory canals free of infection, cerumen impaction or lesions. Tympanic membranes intact with good landmarks and normal mobility on pneumatic otoscopy. No middle ear effusion. Hearing: Speech reception grossly normal. Nose: External nose normal with midline dorsum and no lesions or deformity. Nasal Cavity reveals essentially midline septum with normal inferior turbinates.Mild mucosal congestion and erythema. Nasal secretions are minimal and clear. No polyps seen on anterior rhinoscopy. Oral Cavity/ Oropharynx: Lips are normal with no lesions. Teeth no frank dental caries. Gingiva healthy with no lesions or gingivitis. Oropharynx reveals 3+ swollen tonsils, right tonsil more swollen than left, without swelling of the adjacent palate or uvular shift. Indirect Laryngoscopy/Nasopharyngoscopy: Visualization of the larynx, hypopharynx and nasopharynx is not possible in this setting with routine examination. Neck: Supple and symmetric with tender jugulodigastric adenopathy. The trachea is midline. Thyroid gland is soft, nontender and symmetric with no masses or enlargement. Parotid and submandibular glands are soft, nontender and symmetric, without masses. Lymphatic: Cervical lymph nodes with noted tender lymphadenopathy  Respiratory: Normal respiratory effort without labored breathing. Cardiovascular: Carotid pulse shows regular rate and rhythm Neurologic: Cranial Nerves II through XII are  grossly intact. Eyes: Gaze and Ocular Motility are grossly normal. PERRLA. No visible nystagmus.  MEDICAL DECISION MAKING: Data Review:  Results for orders placed or performed during the hospital encounter of 03/21/20 (from the past 48 hour(s))  CBC with Differential     Status: None   Collection Time: 03/21/20  2:29 AM  Result Value Ref Range   WBC 5.5 4.0 - 10.5 K/uL   RBC 4.35 3.87 - 5.11 MIL/uL   Hemoglobin 13.6 12.0 - 15.0 g/dL   HCT 41.7 36.0 - 46.0 %   MCV 95.9 80.0 - 100.0 fL   MCH 31.3 26.0 - 34.0 pg   MCHC 32.6 30.0 - 36.0 g/dL   RDW 13.9 11.5 - 15.5 %   Platelets 251 150 - 400 K/uL   nRBC 0.0 0.0 - 0.2 %   Neutrophils Relative % 43 %   Neutro Abs 2.4 1.7 - 7.7 K/uL   Lymphocytes Relative 41 %   Lymphs Abs 2.3 0.7 - 4.0 K/uL   Monocytes Relative 11 %   Monocytes Absolute 0.6  0.1 - 1.0 K/uL   Eosinophils Relative 4 %   Eosinophils Absolute 0.2 0.0 - 0.5 K/uL   Basophils Relative 1 %   Basophils Absolute 0.1 0.0 - 0.1 K/uL   Immature Granulocytes 0 %   Abs Immature Granulocytes 0.01 0.00 - 0.07 K/uL    Comment: Performed at [facility], [address], [place], [place] [address]  Comprehensive metabolic panel     Status: Abnormal   Collection Time: 03/21/20  2:29 AM  Result Value Ref Range   Sodium 138 135 - 145 mmol/L   Potassium 3.9 3.5 - 5.1 mmol/L   Chloride 106 98 - 111 mmol/L   CO2 24 22 - 32 mmol/L   Glucose, Bld 116 (H) 70 - 99 mg/dL    Comment: Glucose reference range applies only to samples taken after fasting for at least 8 hours.   BUN 20 6 - 20 mg/dL   Creatinine, Ser 1.15 (H) 0.44 - 1.00 mg/dL   Calcium 8.8 (L) 8.9 - 10.3 mg/dL   Total Protein 7.5 6.5 - 8.1 g/dL   Albumin 3.4 (L) 3.5 - 5.0 g/dL   AST 22 15 - 41 U/L   ALT 26 0 - 44 U/L   Alkaline Phosphatase 39 38 - 126 U/L   Total Bilirubin 0.6 0.3 - 1.2 mg/dL   GFR calc non Af Amer 59 (L) >60 mL/min   GFR calc Af Amer >60 >60 mL/min   Anion gap 8 5 - 15    Comment: Performed at  [facility], [address], [place],  [address]  . CT Soft Tissue Neck W Contrast  Result Date: 03/21/2020 CLINICAL DATA:  Initial evaluation for acute throat swelling with pain, epiglottitis or tonsillitis suspected. EXAM: CT NECK WITH CONTRAST TECHNIQUE: Multidetector CT imaging of the neck was performed using the standard protocol following the bolus administration of intravenous contrast. CONTRAST:  68mL OMNIPAQUE IOHEXOL 300 MG/ML  SOLN COMPARISON:  None available. FINDINGS: Pharynx and larynx: Oral cavity within normal limits without discrete mass or collection. No acute inflammatory changes seen about the dentition. Palatine tonsils are prominent and hypertrophied bilaterally with mild hyperenhancement, suggesting acute tonsillitis. Tonsils abut at the midline. No discrete tonsillar or peritonsillar abscess. Lingual tonsils are prominent as well. Additionally, there is mild thickening and edematous changes involving the epiglottis, suggesting associated/concomitant epiglottitis/supraglottitis. Remainder of the oropharynx and nasopharynx within normal limits. No retropharyngeal collection or swelling. Remainder of the hypopharynx and supraglottic larynx within normal limits. True cords symmetric and normal. Trace layering secretions noted within the subglottic trachea. Salivary glands: Parotid glands within normal limits. Left submandibular gland normal. Multiple stones seen within the distal aspect of Wharton's duct on the right, largest of which measures up to 12 mm (series 2, images 41, 44). No evidence for acute sialoadenitis. Thyroid: Heterogeneous left thyroid nodule measures up to 15 mm (series 2, image 81). Few additional subcentimeter thyroid nodules noted as well. Lymph nodes: Prominent bilateral level II lymph nodes measure up to 11 mm on the right and 14 mm on the left. Findings presumably reactive. No other pathologically enlarged lymph nodes within the neck. Vascular: Normal  intravascular enhancement seen throughout the neck. Limited intracranial: Unremarkable. Visualized orbits: Unremarkable. Mastoids and visualized paranasal sinuses: Visualized paranasal sinuses are clear. Visualized mastoids and middle ear cavities are well pneumatized and free of fluid. Skeleton: No acute osseous abnormality. No discrete or worrisome osseous lesions. Upper chest: Visualized upper chest demonstrates no acute finding. Partially visualized  lungs are clear. Other: None. IMPRESSION: 1. Prominent and hypertrophied palatine tonsils and lingual tonsils, consistent with acute tonsillitis. No discrete tonsillar or peritonsillar abscess. Associated mild thickening and edematous changes involving the epiglottis, suggesting associated/concomitant epiglottitis/supraglottitis. 2. Enlarged bilateral level II lymph nodes, presumably reactive. 3. Multiple stones within the distal right submandibular duct as above. No evidence for acute sialoadenitis. 4. 15 mm left thyroid nodule. Further evaluation with dedicated thyroid ultrasound recommended. This could be performed on a nonemergent outpatient basis. (ref: J Am Coll Radiol. 2015 Feb;12(2): 143-50). Electronically Signed   By: Jeannine Boga M.D.   On: 03/21/2020 03:52  .   PROCEDURE: Procedure: Diagnostic Fiberoptic Nasolaryngoscopy Diagnosis: Tonsillitis, possible epiglottis Indications: evaluate airway Findings:Nasal cavity and nasopharynx clear. Lingual tonsil swollen and narrows the airway. There may be some mild edema of the lingual surface of the epiglottis but the epiglottis as a whole is free of erythema, exudate and edema. TVC clear and mobile with patent laryngeal airway. Description of Procedure: After discussing procedure and risks  (primarily nose bleed) with the patient, the nose was anesthetized with topical Lidocaine 2%. A flexible fiberoptic scope was passed through the nasal cavity. The nasal cavity was inspected and the scope passed  through the Nasopharynx to the region of the hypopharynx and larynx. The patient was instructed to phonate to assess vocal cord mobility. The tongue was extended to evaluate the tongue base completely. Valsalva was performed to insufflate the hypopharynx for improved examination. Findings are as noted above. The scope was withdrawn. The patient tolerated the procedure well.  ASSESSMENT: Acute tonsillitis with associated lingual tonsillitis. Clinically she does not have true epiglottitis. There is some mild swelling of the lingual surface of the epiglottis adjacent to the swollen lingual tonsil.   PLAN: Recommend admission for hydration, IV antibiotics (Rocephin or Unasyn appropriate) and IV Decadron. Likely can be discharged in 24 hours if showing continued improvement and tolerating PO liquids.   Heidi Nearing, MD 03/21/2020 4:50 AM

## 2020-03-21 NOTE — H&P (Signed)
[facility] NAME: Kata Orum    MR#:  [id number]  DATE OF BIRTH:  11/03/1980  DATE OF ADMISSION:  03/21/2020  PRIMARY CARE PHYSICIAN: Patient, No Pcp Per   REQUESTING/REFERRING PHYSICIAN: Marjean Donna, MD CHIEF COMPLAINT:   Chief Complaint  Patient presents with  . Oral Swelling    HISTORY OF PRESENT ILLNESS:  Olive Fatemi  is a 39 y.o. female with a known history of GERD, who presented to the emergency room with acute onset of sore throat and swelling that woke her up this morning with tactile fever.  She graded her odynophagia 8/10 in severity.  She felt she was choking with fluids with stridor and dyspnea on arrival to the ER that has resolved.  She has mild cough and wheezing with her asthma but denied chest pain.  No nausea or vomiting or abdominal pain. She admits to rhinorrhea and nasal congestion.  Upon presentation to the emergency room, blood pressure was 147/93 with a pulse of 105 with otherwise normal vital signs.  Labs revealed a creatinine 1.15 with unremarkable CMP otherwise and normal CBC.  Rapid strep test was negative.  Portable chest x-ray showed questionable subtle right lower lobe infiltrate with bronchitic changes.  CT soft tissue neck showed prominent and hypertrophied palatine tonsils and lingual tonsils consistent with acute tonsillitis with no discrete tonsillar or peritonsillar abscess.  It also showed mild thickening and edematous changes involving the epiglottis suggesting associated epiglottitis/supraglottitis.  There were enlarged bilateral level 2 lymph nodes presumably reactive and multiple stones within the distal right submandibular duct with no evidence for acute sialoadenitis.  It also showed 50 mm left thyroid nodule with recommendation for thyroid ultrasound it can be done on outpatient basis.  ENT consult was obtained and she was seen and scoped in the ER by Dr. Richardson Landry.  The patient was given IV Rocephin,  Decadron, Pepcid, Benadryl and Solu-Medrol as well as IV morphine sulfate.   PAST MEDICAL HISTORY:   Past Medical History:  Diagnosis Date  . Fibroids   . GERD (gastroesophageal reflux disease)     PAST SURGICAL HISTORY:   Past Surgical History:  Procedure Laterality Date  . COLPOSCOPY W/ BIOPSY / CURETTAGE  12/2015    SOCIAL HISTORY:   Social History   Tobacco Use  . Smoking status: Current Every Day Smoker    Packs/day: 0.25    Types: Cigarettes  . Smokeless tobacco: Never Used  Substance Use Topics  . Alcohol use: Yes    Alcohol/week: 2.0 standard drinks    Types: 2 Glasses of wine per week    Comment: "i just drink on the weekends"    FAMILY HISTORY:   Family History  Problem Relation Age of Onset  . Diabetes Mother     DRUG ALLERGIES:  No Known Allergies  REVIEW OF SYSTEMS:   ROS As per history of present illness. All pertinent systems were reviewed above. Constitutional,  HEENT, cardiovascular, respiratory, GI, GU, musculoskeletal, neuro, psychiatric, endocrine,  integumentary and hematologic systems were reviewed and are otherwise  negative/unremarkable except for positive findings mentioned above in the HPI.   MEDICATIONS AT HOME:   Prior to Admission medications   Medication Sig Start Date End Date Taking? Authorizing Provider  omeprazole (PRILOSEC) 20 MG capsule Take 20 mg by mouth.    Yes [provider]  benzonatate (TESSALON PERLES) 100 MG capsule Take 2 capsules (200 mg total) by mouth 3 (three) times daily as  needed. Patient not taking: Reported on 03/21/2020 12/18/19 12/17/20  Sable Feil, PA-C  fexofenadine-pseudoephedrine (ALLEGRA-D) 60-120 MG 12 hr tablet Take 1 tablet by mouth 2 (two) times daily. Patient not taking: Reported on 03/21/2020 12/18/19   Sable Feil, PA-C  ibuprofen (ADVIL) 600 MG tablet Take 1 tablet (600 mg total) by mouth every 8 (eight) hours as needed. Patient not taking: Reported on 03/21/2020 12/18/19   Sable Feil, PA-C      VITAL SIGNS:  Blood pressure (!) 147/93, pulse (!) 105, temperature 99 F (37.2 C), temperature source Oral, resp. rate 20, height 5\' 7"  (1.702 m), weight 90.7 kg, last menstrual period 03/07/2020, SpO2 96 %.  PHYSICAL EXAMINATION:  Physical Exam  GENERAL:  39 y.o.-year-old patient lying in the bed with no acute distress.  EYES: Pupils equal, round, reactive to light and accommodation. No scleral icterus. Extraocular muscles intact.  HEENT: Head atraumatic, normocephalic. Oropharynx with swollen red tonsils right more than left and nasopharynx clear.  NECK:  Supple, no jugular venous distention. No thyroid enlargement, no tenderness. Tender right >left submandibular/ tonsillar lymph nodes.  LUNGS: Normal breath sounds bilaterally, no wheezing, rales,rhonchi or crepitation. No use of accessory muscles of respiration.  CARDIOVASCULAR: Regular rate and rhythm, S1, S2 normal. No murmurs, rubs, or gallops.  ABDOMEN: Soft, nondistended, nontender. Bowel sounds present. No organomegaly or mass.  EXTREMITIES: No pedal edema, cyanosis, or clubbing.  NEUROLOGIC: Cranial nerves II through XII are intact. Muscle strength 5/5 in all extremities. Sensation intact. Gait not checked.  PSYCHIATRIC: The patient is alert and oriented x 3.  Normal affect and good eye contact. SKIN: No obvious rash, lesion, or ulcer.   LABORATORY PANEL:   CBC Recent Labs  Lab 03/21/20 0229  WBC 5.5  HGB 13.6  HCT 41.7  PLT 251   ------------------------------------------------------------------------------------------------------------------  Chemistries  Recent Labs  Lab 03/21/20 0229  NA 138  K 3.9  CL 106  CO2 24  GLUCOSE 116*  BUN 20  CREATININE 1.15*  CALCIUM 8.8*  AST 22  ALT 26  ALKPHOS 39  BILITOT 0.6   ------------------------------------------------------------------------------------------------------------------  Cardiac Enzymes No results for input(s): TROPONINI in  the last 168 hours. ------------------------------------------------------------------------------------------------------------------  RADIOLOGY:  CT Soft Tissue Neck W Contrast  Result Date: 03/21/2020 CLINICAL DATA:  Initial evaluation for acute throat swelling with pain, epiglottitis or tonsillitis suspected. EXAM: CT NECK WITH CONTRAST TECHNIQUE: Multidetector CT imaging of the neck was performed using the standard protocol following the bolus administration of intravenous contrast. CONTRAST:  79mL OMNIPAQUE IOHEXOL 300 MG/ML  SOLN COMPARISON:  None available. FINDINGS: Pharynx and larynx: Oral cavity within normal limits without discrete mass or collection. No acute inflammatory changes seen about the dentition. Palatine tonsils are prominent and hypertrophied bilaterally with mild hyperenhancement, suggesting acute tonsillitis. Tonsils abut at the midline. No discrete tonsillar or peritonsillar abscess. Lingual tonsils are prominent as well. Additionally, there is mild thickening and edematous changes involving the epiglottis, suggesting associated/concomitant epiglottitis/supraglottitis. Remainder of the oropharynx and nasopharynx within normal limits. No retropharyngeal collection or swelling. Remainder of the hypopharynx and supraglottic larynx within normal limits. True cords symmetric and normal. Trace layering secretions noted within the subglottic trachea. Salivary glands: Parotid glands within normal limits. Left submandibular gland normal. Multiple stones seen within the distal aspect of Wharton's duct on the right, largest of which measures up to 12 mm (series 2, images 41, 44). No evidence for acute sialoadenitis. Thyroid: Heterogeneous left thyroid nodule measures up to 15 mm (  series 2, image 81). Few additional subcentimeter thyroid nodules noted as well. Lymph nodes: Prominent bilateral level II lymph nodes measure up to 11 mm on the right and 14 mm on the left. Findings presumably  reactive. No other pathologically enlarged lymph nodes within the neck. Vascular: Normal intravascular enhancement seen throughout the neck. Limited intracranial: Unremarkable. Visualized orbits: Unremarkable. Mastoids and visualized paranasal sinuses: Visualized paranasal sinuses are clear. Visualized mastoids and middle ear cavities are well pneumatized and free of fluid. Skeleton: No acute osseous abnormality. No discrete or worrisome osseous lesions. Upper chest: Visualized upper chest demonstrates no acute finding. Partially visualized lungs are clear. Other: None. IMPRESSION: 1. Prominent and hypertrophied palatine tonsils and lingual tonsils, consistent with acute tonsillitis. No discrete tonsillar or peritonsillar abscess. Associated mild thickening and edematous changes involving the epiglottis, suggesting associated/concomitant epiglottitis/supraglottitis. 2. Enlarged bilateral level II lymph nodes, presumably reactive. 3. Multiple stones within the distal right submandibular duct as above. No evidence for acute sialoadenitis. 4. 15 mm left thyroid nodule. Further evaluation with dedicated thyroid ultrasound recommended. This could be performed on a nonemergent outpatient basis. (ref: J Am Coll Radiol. 2015 Feb;12(2): 143-50). Electronically Signed   By: Jeannine Boga M.D.   On: 03/21/2020 03:52      IMPRESSION AND PLAN:   1.  Acute tonsillitis with associated lingual tonsillitis.  The patient was initially thought to have true epiglottitis however endoscopy showed only mild swelling of the lingual surface of the epiglottis adjacent to the swollen lingual tonsil per Dr. Richardson Landry. -Will be admitted to a medical monitored bed. -We will continue antibiotic therapy with IV Rocephin. -We will continue steroid therapy with IV Decadron. -We will also continue IV Pepcid and Benadryl for now. -She will be on clear liquids which can be advanced with improvement of her symptoms.  2.   GERD. -Patient will be on IV Pepcid as mentioned above.  3.  DVT prophylaxis. -Subcutaneous Lovenox.    All the records are reviewed and case discussed with ED provider. The plan of care was discussed in details with the patient (and family). I answered all questions. The patient agreed to proceed with the above mentioned plan. Further management will depend upon hospital course.   CODE STATUS: Full code  Status is: Inpatient  Remains inpatient appropriate because:Ongoing active pain requiring inpatient pain management, Ongoing diagnostic testing needed not appropriate for outpatient work up, Unsafe d/c plan, IV treatments appropriate due to intensity of illness or inability to take PO and Inpatient level of care appropriate due to severity of illness   Dispo: The patient is from: Home              Anticipated d/c is to: Home              Anticipated d/c date is: 2 days              Patient currently is not medically stable to d/c.   TOTAL TIME TAKING CARE OF THIS PATIENT: 50 minutes.    Christel Mormon M.D on 03/21/2020 at 7:18 AM  [facility]   From 7 PM-7 AM, contact night-coverage [email or web address]  CC: Primary care physician; Patient, No Pcp Per   Note: This dictation was prepared with Dragon dictation along with smaller phrase technology. Any transcriptional errors that result from this process are unintentional.

## 2020-03-21 NOTE — Progress Notes (Signed)
PROGRESS NOTE    Heidi Hill  [id number] DOB: 11-Mar-1981 DOA: 03/21/2020 PCP: Patient, No Pcp Per   Assessment & Plan:   Active Problems:   Epiglottitis   Tonsillitis: continue on IV ceftriaxone, IV decadron as per gen surg. Continue on clear liquid diet  Hyperglycemia: no hx of DM. Will continue to monitor   AKI: baseline Cr is unknown. Continue on IVFs. Avoid nephrotoxic meds    DVT prophylaxis: lovenox Code Status: full  Family Communication:  Disposition Plan: likely d/c back home. Unlikely any barriers  Status is: Inpatient  Remains inpatient appropriate because:IV treatments appropriate due to intensity of illness or inability to take PO   Dispo: The patient is from: Home              Anticipated d/c is to: Home              Anticipated d/c date is: 1 day              Patient currently is not medically stable to d/c.    Consultants:   gen surg   Procedures:    Antimicrobials: rocephin   Subjective: Pt c/o sore throat   Objective: Vitals:   03/21/20 0217  BP: (!) 147/93  Pulse: (!) 105  Resp: 20  Temp: 99 F (37.2 C)  TempSrc: Oral  SpO2: 96%  Weight: 90.7 kg  Height: 5\' 7"  (1.702 m)   No intake or output data in the 24 hours ending 03/21/20 0806 Filed Weights   03/21/20 0217  Weight: 90.7 kg    Examination:  General exam: Appears calm and comfortable  Respiratory system: Clear to auscultation. Respiratory effort normal. No rales Cardiovascular system: S1 & S2 +. No ,rubs, gallops or clicks. No pedal edema. Gastrointestinal system: Abdomen is nondistended, soft and nontender. Normal bowel sounds heard. Central nervous system: Alert and oriented. Moves all 4 extremities  Psychiatry: Judgement and insight appear normal. Mood & affect appropriate.     Data Reviewed: I have personally reviewed following labs and imaging studies  CBC: Recent Labs  Lab 03/21/20 0229  WBC 5.5  NEUTROABS 2.4  HGB 13.6  HCT 41.7  MCV 95.9    PLT 123XX123   Basic Metabolic Panel: Recent Labs  Lab 03/21/20 0229  NA 138  K 3.9  CL 106  CO2 24  GLUCOSE 116*  BUN 20  CREATININE 1.15*  CALCIUM 8.8*   GFR: Estimated Creatinine Clearance: 75.1 mL/min (A) (by C-G formula based on SCr of 1.15 mg/dL (H)). Liver Function Tests: Recent Labs  Lab 03/21/20 0229  AST 22  ALT 26  ALKPHOS 39  BILITOT 0.6  PROT 7.5  ALBUMIN 3.4*   No results for input(s): LIPASE, AMYLASE in the last 168 hours. No results for input(s): AMMONIA in the last 168 hours. Coagulation Profile: No results for input(s): INR, PROTIME in the last 168 hours. Cardiac Enzymes: No results for input(s): CKTOTAL, CKMB, CKMBINDEX, TROPONINI in the last 168 hours. BNP (last 3 results) No results for input(s): PROBNP in the last 8760 hours. HbA1C: No results for input(s): HGBA1C in the last 72 hours. CBG: No results for input(s): GLUCAP in the last 168 hours. Lipid Profile: No results for input(s): CHOL, HDL, LDLCALC, TRIG, CHOLHDL, LDLDIRECT in the last 72 hours. Thyroid Function Tests: No results for input(s): TSH, T4TOTAL, FREET4, T3FREE, THYROIDAB in the last 72 hours. Anemia Panel: No results for input(s): VITAMINB12, FOLATE, FERRITIN, TIBC, IRON, RETICCTPCT in the last 72 hours. Sepsis  Labs: No results for input(s): PROCALCITON, LATICACIDVEN in the last 168 hours.  Recent Results (from the past 240 hour(s))  Group A Strep by PCR ([facility] Only)     Status: None   Collection Time: 03/21/20  4:29 AM   Specimen: Throat; Sterile Swab  Result Value Ref Range Status   Group A Strep by PCR NOT DETECTED NOT DETECTED Final    Comment: Performed at [facility], [address] [place], [place] [address]         Radiology Studies: CT Soft Tissue Neck W Contrast  Result Date: 03/21/2020 CLINICAL DATA:  Initial evaluation for acute throat swelling with pain, epiglottitis or tonsillitis suspected. EXAM: CT NECK WITH CONTRAST TECHNIQUE: Multidetector  CT imaging of the neck was performed using the standard protocol following the bolus administration of intravenous contrast. CONTRAST:  33mL OMNIPAQUE IOHEXOL 300 MG/ML  SOLN COMPARISON:  None available. FINDINGS: Pharynx and larynx: Oral cavity within normal limits without discrete mass or collection. No acute inflammatory changes seen about the dentition. Palatine tonsils are prominent and hypertrophied bilaterally with mild hyperenhancement, suggesting acute tonsillitis. Tonsils abut at the midline. No discrete tonsillar or peritonsillar abscess. Lingual tonsils are prominent as well. Additionally, there is mild thickening and edematous changes involving the epiglottis, suggesting associated/concomitant epiglottitis/supraglottitis. Remainder of the oropharynx and nasopharynx within normal limits. No retropharyngeal collection or swelling. Remainder of the hypopharynx and supraglottic larynx within normal limits. True cords symmetric and normal. Trace layering secretions noted within the subglottic trachea. Salivary glands: Parotid glands within normal limits. Left submandibular gland normal. Multiple stones seen within the distal aspect of Wharton's duct on the right, largest of which measures up to 12 mm (series 2, images 41, 44). No evidence for acute sialoadenitis. Thyroid: Heterogeneous left thyroid nodule measures up to 15 mm (series 2, image 81). Few additional subcentimeter thyroid nodules noted as well. Lymph nodes: Prominent bilateral level II lymph nodes measure up to 11 mm on the right and 14 mm on the left. Findings presumably reactive. No other pathologically enlarged lymph nodes within the neck. Vascular: Normal intravascular enhancement seen throughout the neck. Limited intracranial: Unremarkable. Visualized orbits: Unremarkable. Mastoids and visualized paranasal sinuses: Visualized paranasal sinuses are clear. Visualized mastoids and middle ear cavities are well pneumatized and free of fluid.  Skeleton: No acute osseous abnormality. No discrete or worrisome osseous lesions. Upper chest: Visualized upper chest demonstrates no acute finding. Partially visualized lungs are clear. Other: None. IMPRESSION: 1. Prominent and hypertrophied palatine tonsils and lingual tonsils, consistent with acute tonsillitis. No discrete tonsillar or peritonsillar abscess. Associated mild thickening and edematous changes involving the epiglottis, suggesting associated/concomitant epiglottitis/supraglottitis. 2. Enlarged bilateral level II lymph nodes, presumably reactive. 3. Multiple stones within the distal right submandibular duct as above. No evidence for acute sialoadenitis. 4. 15 mm left thyroid nodule. Further evaluation with dedicated thyroid ultrasound recommended. This could be performed on a nonemergent outpatient basis. (ref: J Am Coll Radiol. 2015 Feb;12(2): 143-50). Electronically Signed   By: Jeannine Boga M.D.   On: 03/21/2020 03:52        Scheduled Meds: . [START ON 03/22/2020] dexamethasone (DECADRON) injection  6 mg Intravenous Q24H  . diphenhydrAMINE  25 mg Intravenous Q6H  . enoxaparin (LOVENOX) injection  40 mg Subcutaneous Q24H  . EPINEPHrine       Continuous Infusions: . sodium chloride    . [START ON 03/22/2020] cefTRIAXone (ROCEPHIN)  IV    . famotidine (PEPCID) IV  LOS: 0 days    Time spent: 30 mins     Wyvonnia Dusky, MD [facility] Pager [phone]  If 7PM-7AM, please contact night-coverage [email or web address] 03/21/2020, 8:06 AM

## 2020-03-21 NOTE — ED Provider Notes (Signed)
[facility] Emergency Department Provider Note  ____________________________________________   First MD Initiated Contact with Patient 03/21/20 [id number]     (approximate)  I have reviewed the triage vital signs and the nursing notes.   HISTORY  Chief Complaint Oral Swelling   HPI Heidi Hill is a 39 y.o. female with below list of previous medical conditions presents to emergency department secondary to acute onset sensation of throat swelling and discomfort on awakening this morning.  Patient states that she was in her usual state of health before going to discomfort 8 out of 10 throat pain.  Patient also admits to choking when she attempts to drink.       Past Medical History:  Diagnosis Date  . Fibroids   . GERD (gastroesophageal reflux disease)     Patient Active Problem List   Diagnosis Date Noted  . Asthma 09/01/2018  . Dysmenorrhea 08/26/2016  . Tobacco user 08/26/2016  . Cervical dysplasia 12/19/2015  . History of depression 11/09/2013  . Thyroid activity decreased 08/29/2013  . Uterine leiomyoma 08/29/2013    Past Surgical History:  Procedure Laterality Date  . COLPOSCOPY W/ BIOPSY / CURETTAGE  12/2015    Prior to Admission medications   Medication Sig Start Date End Date Taking? Authorizing Provider  albuterol (PROVENTIL HFA;VENTOLIN HFA) 108 (90 Base) MCG/ACT inhaler Inhale 2 puffs into the lungs every 4 (four) hours as needed for up to 7 days for wheezing or shortness of breath. 01/12/19 01/19/19  Kara Dies, NP  benzonatate (TESSALON PERLES) 100 MG capsule Take 2 capsules (200 mg total) by mouth 3 (three) times daily as needed. 12/18/19 12/17/20  Sable Feil, PA-C  fexofenadine-pseudoephedrine (ALLEGRA-D) 60-120 MG 12 hr tablet Take 1 tablet by mouth 2 (two) times daily. 12/18/19   Sable Feil, PA-C  ibuprofen (ADVIL) 600 MG tablet Take 1 tablet (600 mg total) by mouth every 8 (eight) hours as needed. 12/18/19   Sable Feil, PA-C  omeprazole (PRILOSEC) 10 MG capsule Take 20 mg by mouth.    [provider]    Allergies Patient has no known allergies.  Family History  Problem Relation Age of Onset  . Diabetes Mother     Social History Social History   Tobacco Use  . Smoking status: Current Every Day Smoker    Packs/day: 0.25    Types: Cigarettes  . Smokeless tobacco: Never Used  Substance Use Topics  . Alcohol use: Yes    Alcohol/week: 2.0 standard drinks    Types: 2 Glasses of wine per week    Comment: "i just drink on the weekends"  . Drug use: No    Review of Systems Constitutional: No fever/chills Eyes: No visual changes. ENT: Positive for sore throat. Cardiovascular: Denies chest pain. Respiratory: Denies shortness of breath. Gastrointestinal: No abdominal pain.  No nausea, no vomiting.  No diarrhea.  No constipation. Genitourinary: Negative for dysuria. Musculoskeletal: Negative for neck pain.  Negative for back pain. Integumentary: Negative for rash. Neurological: Negative for headaches, focal weakness or numbness.   ____________________________________________   PHYSICAL EXAM:  VITAL SIGNS: ED Triage Vitals [03/21/20 0217]  Enc Vitals Group     BP (!) 147/93     Pulse Rate (!) 105     Resp 20     Temp 99 F (37.2 C)     Temp Source Oral     SpO2 96 %     Weight 90.7 kg (200 lb)  Height 1.702 m (5\' 7" )     Head Circumference      Peak Flow      Pain Score 8     Pain Loc      Pain Edu?      Excl. in [place]?     Constitutional: Alert and oriented.  Eyes: Conjunctivae are normal.  Nose: No congestion/rhinnorhea. Mouth/Throat: Tonsillar hypertrophy and erythema noted.  No exudate.  Positive stridor Neck: No stridor.  No meningeal signs.   Cardiovascular: Normal rate, regular rhythm. Good peripheral circulation. Grossly normal heart sounds. Respiratory: Normal respiratory effort.  No retractions. Gastrointestinal: Soft and nontender. No  distention.  Musculoskeletal: No lower extremity tenderness nor edema. No gross deformities of extremities. Neurologic:  Normal speech and language. No gross focal neurologic deficits are appreciated.  Skin:  Skin is warm, dry and intact. Psychiatric: Mood and affect are normal. Speech and behavior are normal.  ____________________________________________   LABS (all labs ordered are listed, but only abnormal results are displayed)  Labs Reviewed  COMPREHENSIVE METABOLIC PANEL - Abnormal; Notable for the following components:      Result Value   Glucose, Bld 116 (*)    Creatinine, Ser 1.15 (*)    Calcium 8.8 (*)    Albumin 3.4 (*)    GFR calc non Af Amer 59 (*)    All other components within normal limits  GROUP A STREP BY PCR  CBC WITH DIFFERENTIAL/PLATELET     RADIOLOGY I, [place] Tris Howell, personally viewed and evaluated these images (plain radiographs) as part of my medical decision making, as well as reviewing the written report by the radiologist.  ED MD interpretation: Prominent hypertrophied palatine tonsils and lingual tonsils consistent with acute tonsillitis and associated mild thickening and edematous changes involving the epiglottis suggesting associated concomitant epiglottitis epiglottitis on CT soft tissue impression per radiologist  Official radiology report(s): CT Soft Tissue Neck W Contrast  Result Date: 03/21/2020 CLINICAL DATA:  Initial evaluation for acute throat swelling with pain, epiglottitis or tonsillitis suspected. EXAM: CT NECK WITH CONTRAST TECHNIQUE: Multidetector CT imaging of the neck was performed using the standard protocol following the bolus administration of intravenous contrast. CONTRAST:  53mL OMNIPAQUE IOHEXOL 300 MG/ML  SOLN COMPARISON:  None available. FINDINGS: Pharynx and larynx: Oral cavity within normal limits without discrete mass or collection. No acute inflammatory changes seen about the dentition. Palatine tonsils are prominent and  hypertrophied bilaterally with mild hyperenhancement, suggesting acute tonsillitis. Tonsils abut at the midline. No discrete tonsillar or peritonsillar abscess. Lingual tonsils are prominent as well. Additionally, there is mild thickening and edematous changes involving the epiglottis, suggesting associated/concomitant epiglottitis/supraglottitis. Remainder of the oropharynx and nasopharynx within normal limits. No retropharyngeal collection or swelling. Remainder of the hypopharynx and supraglottic larynx within normal limits. True cords symmetric and normal. Trace layering secretions noted within the subglottic trachea. Salivary glands: Parotid glands within normal limits. Left submandibular gland normal. Multiple stones seen within the distal aspect of Wharton's duct on the right, largest of which measures up to 12 mm (series 2, images 41, 44). No evidence for acute sialoadenitis. Thyroid: Heterogeneous left thyroid nodule measures up to 15 mm (series 2, image 81). Few additional subcentimeter thyroid nodules noted as well. Lymph nodes: Prominent bilateral level II lymph nodes measure up to 11 mm on the right and 14 mm on the left. Findings presumably reactive. No other pathologically enlarged lymph nodes within the neck. Vascular: Normal intravascular enhancement seen throughout the neck. Limited intracranial: Unremarkable. Visualized  orbits: Unremarkable. Mastoids and visualized paranasal sinuses: Visualized paranasal sinuses are clear. Visualized mastoids and middle ear cavities are well pneumatized and free of fluid. Skeleton: No acute osseous abnormality. No discrete or worrisome osseous lesions. Upper chest: Visualized upper chest demonstrates no acute finding. Partially visualized lungs are clear. Other: None. IMPRESSION: 1. Prominent and hypertrophied palatine tonsils and lingual tonsils, consistent with acute tonsillitis. No discrete tonsillar or peritonsillar abscess. Associated mild thickening and  edematous changes involving the epiglottis, suggesting associated/concomitant epiglottitis/supraglottitis. 2. Enlarged bilateral level II lymph nodes, presumably reactive. 3. Multiple stones within the distal right submandibular duct as above. No evidence for acute sialoadenitis. 4. 15 mm left thyroid nodule. Further evaluation with dedicated thyroid ultrasound recommended. This could be performed on a nonemergent outpatient basis. (ref: J Am Coll Radiol. 2015 Feb;12(2): 143-50). Electronically Signed   By: Jeannine Boga M.D.   On: 03/21/2020 03:52    ___________  Procedures   ____________________________________________   INITIAL IMPRESSION / MDM / [place] / ED COURSE  As part of my medical decision making, I reviewed the following data within the electronic MEDICAL RECORD NUMBER   39 year old female presented with above-stated history and physical exam a differential diagnosis including but not limited to tonsillitis, pharyngitis, epiglottitis.  Patient was given IV Benadryl Solu-Medrol, Pepcid and on reevaluation the patient states that symptoms have improved.  CT scan of the's neck soft tissue was performed given concern for possible tonsillitis and epiglottitis.  CT findings consistent with both per radiologist.  As such patient was given 2 g IV ceftriaxone 10 mg of IV Decadron.  Patient subsequently discussed with Dr. Richardson Landry ENT who presented to the emergency department promptly and performed a fiberoptic scope which revealed no evidence epiglottitis per Dr. Richardson Landry.  Rather Dr. Richardson Landry states that he did visualize evidence of tonsillitis.  Subsequently discussed with the hospitalist staff Dr. Sidney Ace for admission for further evaluation and management.  ____________________________________________  FINAL CLINICAL IMPRESSION(S) / ED DIAGNOSES  Final diagnoses:  Epiglottitis  Tonsillitis     MEDICATIONS GIVEN DURING THIS VISIT:  Medications  EPINEPHrine (EPI-PEN) 0.3  mg/0.3 mL injection (has no administration in time range)  methylPREDNISolone sodium succinate (SOLU-MEDROL) 125 mg/2 mL injection 125 mg (125 mg Intravenous Given 03/21/20 0232)  diphenhydrAMINE (BENADRYL) injection 25 mg (25 mg Intravenous Given 03/21/20 0232)  famotidine (PEPCID) IVPB 20 mg premix (0 mg Intravenous Stopped 03/21/20 0322)  iohexol (OMNIPAQUE) 300 MG/ML solution 75 mL (75 mLs Intravenous Contrast Given 03/21/20 0309)  dexamethasone (DECADRON) injection 10 mg (10 mg Intravenous Given 03/21/20 0425)  cefTRIAXone (ROCEPHIN) 2 g in sodium chloride 0.9 % 100 mL IVPB (0 g Intravenous Stopped 03/21/20 0512)  morphine 2 MG/ML injection 2 mg (2 mg Intravenous Given 03/21/20 0501)     ED Discharge Orders    None      *Please note:  Thana Heeb was evaluated in Emergency Department on 03/21/2020 for the symptoms described in the history of present illness. She was evaluated in the context of the global COVID-19 pandemic, which necessitated consideration that the patient might be at risk for infection with the SARS-CoV-2 virus that causes COVID-19. Institutional protocols and algorithms that pertain to the evaluation of patients at risk for COVID-19 are in a state of rapid change based on information released by regulatory bodies including the CDC and federal and state organizations. These policies and algorithms were followed during the patient's care in the ED.  Some ED evaluations and interventions may be delayed  as a result of limited staffing during the pandemic.*  Note:  This document was prepared using Dragon voice recognition software and may include unintentional dictation errors.   Gregor Hams, MD 03/21/20 [phone]

## 2020-03-21 NOTE — ED Notes (Signed)
Resting in bed, c/o throat aching, tylenol given. Call bell in reach, bed locked and low.

## 2020-03-21 NOTE — ED Notes (Signed)
Transferred to room 31 by this RN, report obtained. Pt denies complaints at this time. Resting in bed, no distress noted, states swelling has improved. Call light in reach, bed locked and low.

## 2020-03-21 NOTE — ED Notes (Signed)
Pt taken to CT at this time, pt A&Ox4 and breathing WDL

## 2020-03-21 NOTE — ED Triage Notes (Addendum)
Pt ambulatory to triage. Pt reports she was awakened tonight with throat swelling and pain.  Pt reports when drinking she feels like she is choking.  Pt alert  Speech clear.

## 2020-03-22 LAB — CBC
HCT: 39.8 % (ref 36.0–46.0)
Hemoglobin: 13.2 g/dL (ref 12.0–15.0)
MCH: 31.4 pg (ref 26.0–34.0)
MCHC: 33.2 g/dL (ref 30.0–36.0)
MCV: 94.5 fL (ref 80.0–100.0)
Platelets: 245 10*3/uL (ref 150–400)
RBC: 4.21 MIL/uL (ref 3.87–5.11)
RDW: 13.7 % (ref 11.5–15.5)
WBC: 9.1 10*3/uL (ref 4.0–10.5)
nRBC: 0 % (ref 0.0–0.2)

## 2020-03-22 LAB — BASIC METABOLIC PANEL
Anion gap: 7 (ref 5–15)
BUN: 15 mg/dL (ref 6–20)
CO2: 23 mmol/L (ref 22–32)
Calcium: 8.6 mg/dL — ABNORMAL LOW (ref 8.9–10.3)
Chloride: 106 mmol/L (ref 98–111)
Creatinine, Ser: 0.93 mg/dL (ref 0.44–1.00)
GFR calc Af Amer: >60 mL/min (ref >60)
GFR calc non Af Amer: >60 mL/min (ref >60)
Glucose, Bld: 136 mg/dL — ABNORMAL HIGH (ref 70–99)
Potassium: 4 mmol/L (ref 3.5–5.1)
Sodium: 136 mmol/L (ref 135–145)

## 2020-03-22 MED ORDER — DEXAMETHASONE 4 MG PO TABS: 4.0000 mg | ORAL_TABLET | Freq: Every day | ORAL | 0 refills | Status: AC

## 2020-03-22 MED ORDER — ACETAMINOPHEN 325 MG PO TABS: 650.0000 mg | ORAL_TABLET | Freq: Four times a day (QID) | ORAL | Status: DC | PRN

## 2020-03-22 MED ORDER — AMOXICILLIN-POT CLAVULANATE 875-125 MG PO TABS: 1.0000 | ORAL_TABLET | Freq: Two times a day (BID) | ORAL | 0 refills | Status: AC

## 2020-03-22 NOTE — Discharge Summary (Signed)
Physician Discharge Summary  Heidi Hill JSE:[id number] DOB: [birth year] DOA: 03/21/2020  PCP: Patient, No Pcp Per  Admit date: 03/21/2020 Discharge date: 03/22/2020  Admitted From: home Disposition:  home  Recommendations for Outpatient Follow-up:  1. Follow up with PCP in 1-2 weeks   Home Health: no Equipment/Devices:  Discharge Condition: stable CODE STATUS: full  Diet recommendation: regular  Brief/Interim Summary: HPI was taken from Dr. Sidney Ace: Heidi Hill  is a 39 y.o. female with a known history of GERD, who presented to the emergency room with acute onset of sore throat and swelling that woke her up this morning with tactile fever.  She graded her odynophagia 8/10 in severity.  She felt she was choking with fluids with stridor and dyspnea on arrival to the ER that has resolved.  She has mild cough and wheezing with her asthma but denied chest pain.  No nausea or vomiting or abdominal pain. She admits to rhinorrhea and nasal congestion.  Upon presentation to the emergency room, blood pressure was 147/93 with a pulse of 105 with otherwise normal vital signs.  Labs revealed a creatinine 1.15 with unremarkable CMP otherwise and normal CBC.  Rapid strep test was negative.  Portable chest x-ray showed questionable subtle right lower lobe infiltrate with bronchitic changes.  CT soft tissue neck showed prominent and hypertrophied palatine tonsils and lingual tonsils consistent with acute tonsillitis with no discrete tonsillar or peritonsillar abscess.  It also showed mild thickening and edematous changes involving the epiglottis suggesting associated epiglottitis/supraglottitis.  There were enlarged bilateral level 2 lymph nodes presumably reactive and multiple stones within the distal right submandibular duct with no evidence for acute sialoadenitis.  It also showed 50 mm left thyroid nodule with recommendation for thyroid ultrasound it can be done on outpatient basis.  ENT consult was  obtained and she was seen and scoped in the ER by Dr. Richardson Landry.  The patient was given IV Rocephin, Decadron, Pepcid, Benadryl and Solu-Medrol as well as IV morphine sulfate.    Hospital Course from Dr. Lenise Herald 6/3-03/22/20: Pt was found to have acute tonsillitis and was treated w/ IV decadron and IV rocephin. Strep PCR was neg. Pt responded well to the above and below stated treatments. Pt was sent home w/ po augmentin and decadron to finish the course at home. Also, pt was found to have AKI that resolved w/ IVFs.   Discharge Diagnoses:  Active Problems:   Epiglottitis   Tonsillitis  Tonsillitis: continue on IV ceftriaxone, IV decadron as per gen surg while inpatient. Transition to po augmentin & po decadron at d/c.    Hyperglycemia: no hx of DM. Will continue to monitor   AKI: resolved   Discharge Instructions  Discharge Instructions    Diet general   Complete by: As directed    Discharge instructions   Complete by: As directed    F/u PCP in 1-2 weeks   Increase activity slowly   Complete by: As directed      Allergies as of 03/22/2020   No Known Allergies     Medication List    STOP taking these medications   ibuprofen 600 MG tablet Commonly known as: ADVIL     TAKE these medications   acetaminophen 325 MG tablet Commonly known as: TYLENOL Take 2 tablets (650 mg total) by mouth every 6 (six) hours as needed for mild pain, moderate pain, fever or headache (or Fever >/= 101).   amoxicillin-clavulanate 875-125 MG tablet Commonly known as: Augmentin Take 1  tablet by mouth 2 (two) times daily for 6 days.   benzonatate 100 MG capsule Commonly known as: Tessalon Perles Take 2 capsules (200 mg total) by mouth 3 (three) times daily as needed.   dexamethasone 4 MG tablet Commonly known as: DECADRON Take 1 tablet (4 mg total) by mouth daily for 6 days.   fexofenadine-pseudoephedrine 60-120 MG 12 hr tablet Commonly known as: ALLEGRA-D Take 1 tablet by mouth 2  (two) times daily.   omeprazole 20 MG capsule Commonly known as: PRILOSEC Take 20 mg by mouth.       No Known Allergies  Consultations:  Gen surg: Dr. Richardson Landry   Procedures/Studies: CT Soft Tissue Neck W Contrast  Result Date: 03/21/2020 CLINICAL DATA:  Initial evaluation for acute throat swelling with pain, epiglottitis or tonsillitis suspected. EXAM: CT NECK WITH CONTRAST TECHNIQUE: Multidetector CT imaging of the neck was performed using the standard protocol following the bolus administration of intravenous contrast. CONTRAST:  24mL OMNIPAQUE IOHEXOL 300 MG/ML  SOLN COMPARISON:  None available. FINDINGS: Pharynx and larynx: Oral cavity within normal limits without discrete mass or collection. No acute inflammatory changes seen about the dentition. Palatine tonsils are prominent and hypertrophied bilaterally with mild hyperenhancement, suggesting acute tonsillitis. Tonsils abut at the midline. No discrete tonsillar or peritonsillar abscess. Lingual tonsils are prominent as well. Additionally, there is mild thickening and edematous changes involving the epiglottis, suggesting associated/concomitant epiglottitis/supraglottitis. Remainder of the oropharynx and nasopharynx within normal limits. No retropharyngeal collection or swelling. Remainder of the hypopharynx and supraglottic larynx within normal limits. True cords symmetric and normal. Trace layering secretions noted within the subglottic trachea. Salivary glands: Parotid glands within normal limits. Left submandibular gland normal. Multiple stones seen within the distal aspect of Wharton's duct on the right, largest of which measures up to 12 mm (series 2, images 41, 44). No evidence for acute sialoadenitis. Thyroid: Heterogeneous left thyroid nodule measures up to 15 mm (series 2, image 81). Few additional subcentimeter thyroid nodules noted as well. Lymph nodes: Prominent bilateral level II lymph nodes measure up to 11 mm on the right and  14 mm on the left. Findings presumably reactive. No other pathologically enlarged lymph nodes within the neck. Vascular: Normal intravascular enhancement seen throughout the neck. Limited intracranial: Unremarkable. Visualized orbits: Unremarkable. Mastoids and visualized paranasal sinuses: Visualized paranasal sinuses are clear. Visualized mastoids and middle ear cavities are well pneumatized and free of fluid. Skeleton: No acute osseous abnormality. No discrete or worrisome osseous lesions. Upper chest: Visualized upper chest demonstrates no acute finding. Partially visualized lungs are clear. Other: None. IMPRESSION: 1. Prominent and hypertrophied palatine tonsils and lingual tonsils, consistent with acute tonsillitis. No discrete tonsillar or peritonsillar abscess. Associated mild thickening and edematous changes involving the epiglottis, suggesting associated/concomitant epiglottitis/supraglottitis. 2. Enlarged bilateral level II lymph nodes, presumably reactive. 3. Multiple stones within the distal right submandibular duct as above. No evidence for acute sialoadenitis. 4. 15 mm left thyroid nodule. Further evaluation with dedicated thyroid ultrasound recommended. This could be performed on a nonemergent outpatient basis. (ref: J Am Coll Radiol. 2015 Feb;12(2): 143-50). Electronically Signed   By: Jeannine Boga M.D.   On: 03/21/2020 03:52      Subjective: Pt denies any complaints    Discharge Exam: Vitals:   03/22/20 0242 03/22/20 0548  BP: (!) 108/57 123/65  Pulse: 73 65  Resp: 18 14  Temp: 98.1 F (36.7 C) 98.5 F (36.9 C)  SpO2: 98% 100%   Vitals:   03/21/20 2058 03/21/20  2238 03/22/20 0242 03/22/20 0548  BP: 137/85 (!) 156/94 (!) 108/57 123/65  Pulse: 76 78 73 65  Resp: 18 16 18 14   Temp:  97.8 F (36.6 C) 98.1 F (36.7 C) 98.5 F (36.9 C)  TempSrc:  Oral Oral Oral  SpO2: 98% 99% 98% 100%  Weight:  92.2 kg    Height:  5\' 7"  (1.702 m)      General: Pt is alert, awake,  not in acute distress Cardiovascular: S1/S2 +, no rubs, no gallops Respiratory: CTA bilaterally, no wheezing, no rhonchi Abdominal: Soft, NT, ND, bowel sounds + Extremities: no edema, no cyanosis    The results of significant diagnostics from this hospitalization (including imaging, microbiology, ancillary and laboratory) are listed below for reference.     Microbiology: Recent Results (from the past 240 hour(s))  Group A Strep by PCR ([place] Only)     Status: None   Collection Time: 03/21/20  4:29 AM   Specimen: Throat; Sterile Swab  Result Value Ref Range Status   Group A Strep by PCR NOT DETECTED NOT DETECTED Final    Comment: Performed at [facility], [address] [place], [place] [address]     Labs: BNP (last 3 results) No results for input(s): BNP in the last 8760 hours. Basic Metabolic Panel: Recent Labs  Lab 03/21/20 0229 03/22/20 0440  NA 138 136  K 3.9 4.0  CL 106 106  CO2 24 23  GLUCOSE 116* 136*  BUN 20 15  CREATININE 1.15* 0.93  CALCIUM 8.8* 8.6*   Liver Function Tests: Recent Labs  Lab 03/21/20 0229  AST 22  ALT 26  ALKPHOS 39  BILITOT 0.6  PROT 7.5  ALBUMIN 3.4*   No results for input(s): LIPASE, AMYLASE in the last 168 hours. No results for input(s): AMMONIA in the last 168 hours. CBC: Recent Labs  Lab 03/21/20 0229 03/22/20 0440  WBC 5.5 9.1  NEUTROABS 2.4  --   HGB 13.6 13.2  HCT 41.7 39.8  MCV 95.9 94.5  PLT 251 245   Cardiac Enzymes: No results for input(s): CKTOTAL, CKMB, CKMBINDEX, TROPONINI in the last 168 hours. BNP: Invalid input(s): POCBNP CBG: No results for input(s): GLUCAP in the last 168 hours. D-Dimer No results for input(s): DDIMER in the last 72 hours. Hgb A1c No results for input(s): HGBA1C in the last 72 hours. Lipid Profile No results for input(s): CHOL, HDL, LDLCALC, TRIG, CHOLHDL, LDLDIRECT in the last 72 hours. Thyroid function studies No results for input(s): TSH, T4TOTAL, T3FREE, THYROIDAB  in the last 72 hours.  Invalid input(s): FREET3 Anemia work up No results for input(s): VITAMINB12, FOLATE, FERRITIN, TIBC, IRON, RETICCTPCT in the last 72 hours. Urinalysis    Component Value Date/Time   COLORURINE YELLOW (A) 07/20/2019 1729   APPEARANCEUR CLOUDY (A) 07/20/2019 1729   APPEARANCEUR Clear 05/06/2013 0758   LABSPEC 1.013 07/20/2019 1729   LABSPEC 1.012 05/06/2013 0758   PHURINE 5.0 07/20/2019 1729   GLUCOSEU NEGATIVE 07/20/2019 1729   GLUCOSEU Negative 05/06/2013 0758   HGBUR MODERATE (A) 07/20/2019 1729   BILIRUBINUR NEGATIVE 07/20/2019 1729   BILIRUBINUR Negative 05/06/2013 0758   KETONESUR 5 (A) 07/20/2019 1729   PROTEINUR 30 (A) 07/20/2019 1729   NITRITE NEGATIVE 07/20/2019 1729   LEUKOCYTESUR MODERATE (A) 07/20/2019 1729   LEUKOCYTESUR Negative 05/06/2013 0758   Sepsis Labs Invalid input(s): PROCALCITONIN,  WBC,  LACTICIDVEN Microbiology Recent Results (from the past 240 hour(s))  Group A Strep by PCR ([place] Only)  Status: None   Collection Time: 03/21/20  4:29 AM   Specimen: Throat; Sterile Swab  Result Value Ref Range Status   Group A Strep by PCR NOT DETECTED NOT DETECTED Final    Comment: Performed at [facility], [address], [place], [place] [address]     Time coordinating discharge: Over 30 minutes  SIGNED:   Wyvonnia Dusky, MD  [facility] 03/22/2020, 2:09 PM Pager   If 7PM-7AM, please contact night-coverage [email or web address]

## 2020-03-22 NOTE — Plan of Care (Addendum)
Discharge order received. Patient mental status is at baseline. Vital signs stable . No signs of acute distress. Discharge instructions given. Patient verbalized understanding. No other issues noted at this time. Pt. has no primary doctor. Pt verbalized that she will find her own Doctor.

## 2020-05-08 ENCOUNTER — Emergency Department: Payer: Self-pay

## 2020-05-08 ENCOUNTER — Other Ambulatory Visit: Payer: Self-pay

## 2020-05-08 ENCOUNTER — Emergency Department
Admission: EM | Admit: 2020-05-08 | Discharge: 2020-05-08 | Disposition: A | Discharge status: Home / Self Care | Payer: Self-pay | Attending: Emergency Medicine | Admitting: Emergency Medicine

## 2020-05-08 DIAGNOSIS — F17210 Nicotine dependence, cigarettes, uncomplicated: Secondary | ICD-10-CM | POA: Insufficient documentation

## 2020-05-08 DIAGNOSIS — R05 Cough: Secondary | ICD-10-CM | POA: Insufficient documentation

## 2020-05-08 DIAGNOSIS — J069 Acute upper respiratory infection, unspecified: Secondary | ICD-10-CM | POA: Insufficient documentation

## 2020-05-08 MED ORDER — PSEUDOEPH-BROMPHEN-DM 30-2-10 MG/5ML PO SYRP
5.0000 mL | ORAL_SOLUTION | Freq: Four times a day (QID) | ORAL | 0 refills | Status: DC | PRN
Start: 2020-05-08 — End: 2021-02-04

## 2020-05-08 MED ORDER — IBUPROFEN 600 MG PO TABS: 600.0000 mg | ORAL_TABLET | Freq: Three times a day (TID) | ORAL | 0 refills | Status: DC | PRN

## 2020-05-08 NOTE — ED Triage Notes (Signed)
Pt c/o cough with congestion and body aches for the past 3 days.

## 2020-05-08 NOTE — ED Provider Notes (Signed)
[facility] Emergency Department Provider Note   ____________________________________________   First MD Initiated Contact with Patient 05/08/20 1000     (approximate)  I have reviewed the triage vital signs and the nursing notes.   HISTORY  Chief Complaint URI    HPI Pollyann Roa is a 39 y.o. female patient complain cough and chest congestion for 3 days.  Patient denies recent travel or known contact with COVID-19.  Patient has not taken vaccine.         Past Medical History:  Diagnosis Date  . Fibroids   . GERD (gastroesophageal reflux disease)     Patient Active Problem List   Diagnosis Date Noted  . Epiglottitis 03/21/2020  . Tonsillitis 03/21/2020  . Asthma 09/01/2018  . Dysmenorrhea 08/26/2016  . Tobacco user 08/26/2016  . Cervical dysplasia 12/19/2015  . History of depression 11/09/2013  . Thyroid activity decreased 08/29/2013  . Uterine leiomyoma 08/29/2013    Past Surgical History:  Procedure Laterality Date  . COLPOSCOPY W/ BIOPSY / CURETTAGE  12/2015    Prior to Admission medications   Medication Sig Start Date End Date Taking? Authorizing Provider  acetaminophen (TYLENOL) 325 MG tablet Take 2 tablets (650 mg total) by mouth every 6 (six) hours as needed for mild pain, moderate pain, fever or headache (or Fever >/= 101). 03/22/20   Wyvonnia Dusky, MD  brompheniramine-pseudoephedrine-DM 30-2-10 MG/5ML syrup Take 5 mLs by mouth 4 (four) times daily as needed. 05/08/20   Sable Feil, PA-C  ibuprofen (ADVIL) 600 MG tablet Take 1 tablet (600 mg total) by mouth every 8 (eight) hours as needed. 05/08/20   Sable Feil, PA-C  omeprazole (PRILOSEC) 20 MG capsule Take 20 mg by mouth.     [provider]    Allergies Patient has no known allergies.  Family History  Problem Relation Age of Onset  . Diabetes Mother     Social History Social History   Tobacco Use  . Smoking status: Current Every Day Smoker     Packs/day: 0.25    Types: Cigarettes  . Smokeless tobacco: Never Used  Substance Use Topics  . Alcohol use: Yes    Alcohol/week: 2.0 standard drinks    Types: 2 Glasses of wine per week    Comment: "i just drink on the weekends"  . Drug use: No    Review of Systems Constitutional: No fever/chills.  Body aches. Eyes: No visual changes. ENT: No sore throat secondary to coughing. Cardiovascular: Denies chest pain. Respiratory: Denies shortness of breath.  Productive cough. Gastrointestinal: No abdominal pain.  No nausea, no vomiting.  No diarrhea.  No constipation. Genitourinary: Negative for dysuria. Musculoskeletal: Negative for back pain. Skin: Negative for rash. Neurological: Negative for headaches, focal weakness or numbness.   ____________________________________________   PHYSICAL EXAM:  VITAL SIGNS: ED Triage Vitals  Enc Vitals Group     BP 05/08/20 0841 (!) 142/96     Pulse Rate 05/08/20 0841 91     Resp 05/08/20 0841 18     Temp 05/08/20 0841 98 F (36.7 C)     Temp Source 05/08/20 0841 Oral     SpO2 05/08/20 0841 97 %     Weight 05/08/20 0838 190 lb (86.2 kg)     Height 05/08/20 0838 5\' 7"  (1.702 m)     Head Circumference --      Peak Flow --      Pain Score 05/08/20 0838 7  Pain Loc --      Pain Edu? --      Excl. in [place]? --    Constitutional: Alert and oriented. Well appearing and in no acute distress. Eyes: Conjunctivae are normal. PERRL. EOMI. Head: Atraumatic. Nose: No congestion/rhinnorhea. Mouth/Throat: Mucous membranes are moist.  Oropharynx non-erythematous.  Postnasal drainage. Neck: No stridor. Hematological/Lymphatic/Immunilogical: No cervical lymphadenopathy. Cardiovascular: Normal rate, regular rhythm. Grossly normal heart sounds.  Good peripheral circulation. Respiratory: Normal respiratory effort.  No retractions. Lungs CTAB. Neurologic:  Normal speech and language. No gross focal neurologic deficits are appreciated. No gait  instability. Skin:  Skin is warm, dry and intact. No rash noted. Psychiatric: Mood and affect are normal. Speech and behavior are normal.  ____________________________________________   LABS (all labs ordered are listed, but only abnormal results are displayed)  Labs Reviewed  SARS CORONAVIRUS 2 BY RT PCR (HOSPITAL ORDER, [place] [facility])   ____________________________________________  EKG   ____________________________________________  [place]  ED MD interpretation:    Official radiology report(s): DG Chest 2 View  Result Date: 05/08/2020 CLINICAL DATA:  Cough.  Additional provided: Congestion, body aches. EXAM: CHEST - 2 VIEW COMPARISON:  Prior chest radiographs 12/18/2019 and earlier. FINDINGS: Heart size within normal limits. There is no appreciable airspace consolidation. No evidence of pleural effusion or pneumothorax. No acute bony abnormality identified. IMPRESSION: No evidence of active cardiopulmonary disease. Electronically Signed   By: Kellie Simmering DO   On: 05/08/2020 10:05    ____________________________________________   PROCEDURES  Procedure(s) performed (including Critical Care):  Procedures   ____________________________________________   INITIAL IMPRESSION / ASSESSMENT AND PLAN / ED COURSE  As part of my medical decision making, I reviewed the following data within the [place]     Patient presents with cough and chest congestion for 3 days.  Patient also state body aches.  Patient complaint physical exam is consistent with viral respiratory infection.  Chest x-ray was unremarkable.  Patient given discharge care instruction.  Take medication as directed.  Advised self quarantine pending results of COVID-19.  Test is positive was quarantine additional 10 days.  Advised if test is negative consider getting the COVID-19 vaccine.    Tishanna Dunford was evaluated in Emergency Department on 05/08/2020 for the  symptoms described in the history of present illness. She was evaluated in the context of the global COVID-19 pandemic, which necessitated consideration that the patient might be at risk for infection with the SARS-CoV-2 virus that causes COVID-19. Institutional protocols and algorithms that pertain to the evaluation of patients at risk for COVID-19 are in a state of rapid change based on information released by regulatory bodies including the CDC and federal and state organizations. These policies and algorithms were followed during the patient's care in the ED.       ____________________________________________   FINAL CLINICAL IMPRESSION(S) / ED DIAGNOSES  Final diagnoses:  Viral URI with cough     ED Discharge Orders         Ordered    brompheniramine-pseudoephedrine-DM 30-2-10 MG/5ML syrup  4 times daily PRN     Discontinue  Reprint     05/08/20 1018    ibuprofen (ADVIL) 600 MG tablet  Every 8 hours PRN     Discontinue  Reprint     05/08/20 1018           Note:  This document was prepared using Dragon voice recognition software and may include unintentional dictation errors.  Sable Feil, PA-C 05/08/20 1029    Lavonia Drafts, MD 05/08/20 [phone]

## 2020-05-08 NOTE — Discharge Instructions (Addendum)
Follow discharge care instruction and take medication as directed.  Advised self quarantine pending results of COVID-19 test.  If test is negative I will schedule COVID-19 vaccine.  If test is positive  quarantine additional 10 days.  Results may be monitored in the MyChart app.  You will be notified if test is positive.

## 2020-05-08 NOTE — ED Notes (Addendum)
See triage note  States she developed a non productive cough on Sunday  No fever or body aches

## 2020-05-08 NOTE — ED Notes (Signed)
This EDT went and attempted to obtain coronavirus swab, this edt inserted swab into nose and pt said "no no no!" and took swab out of nose and threw it on the floor. Pt refused for a second attempt.

## 2020-08-30 ENCOUNTER — Encounter: Payer: Self-pay | Admitting: Physician Assistant

## 2020-08-30 ENCOUNTER — Ambulatory Visit (LOCAL_COMMUNITY_HEALTH_CENTER): Payer: Self-pay | Admitting: Physician Assistant

## 2020-08-30 ENCOUNTER — Other Ambulatory Visit: Payer: Self-pay

## 2020-08-30 VITALS — BP 129/85 | Ht 66.0 in | Wt 195.0 lb

## 2020-08-30 DIAGNOSIS — Z3009 Encounter for other general counseling and advice on contraception: Secondary | ICD-10-CM

## 2020-08-30 DIAGNOSIS — Z30011 Encounter for initial prescription of contraceptive pills: Secondary | ICD-10-CM

## 2020-08-30 DIAGNOSIS — Z0000 Encounter for general adult medical examination without abnormal findings: Secondary | ICD-10-CM

## 2020-08-30 DIAGNOSIS — Z30012 Encounter for prescription of emergency contraception: Secondary | ICD-10-CM

## 2020-08-30 MED ORDER — LEVONORGESTREL 1.5 MG PO TABS: 1.5000 mg | ORAL_TABLET | Freq: Once | ORAL | 0 refills | Status: AC

## 2020-08-30 MED ORDER — NORETHINDRONE ACET-ETHINYL EST 1-20 MG-MCG PO TABS: 1.0000 | ORAL_TABLET | Freq: Every day | ORAL | 13 refills | Status: AC

## 2020-08-30 NOTE — Progress Notes (Signed)
Pt received Plan B per provider order, as well as Microgestin #6 packs given due to supply available and expiration date. Pt aware to give [place] a call when she first opens up her last pack of pills so she can RTC for the remaining packs of birth control pills that we owe her prior to running out and pt states understanding. RN counseling for Plan B and birth control pills completed, instruction sheet for ECP given and consent forms reviewed and signed by pt. Counseled pt per provider verbal order for pt to do a OTC pregnancy test in 2 weeks and if positive stop pills and give [place] a call so she can RTC, and if negative ok to continue with pills. Counseled pt per provider orders and pt states understanding. Provider orders completed.

## 2020-08-30 NOTE — Progress Notes (Signed)
Pt is here for physical and to get on birth control pills. Pt reports last sex was 08/28/2020 without a condom. Pt reports not on a BCM at this time. Pt reports LMP was towards the beginning of 07/2020 but unsure of when.

## 2020-09-01 ENCOUNTER — Encounter: Payer: Self-pay | Admitting: Physician Assistant

## 2020-09-01 NOTE — Progress Notes (Signed)
[facility] [place] Main Number: [phone]    Family Planning Visit- Initial Visit  Subjective:  Heidi Hill is a 39 y.o.  [phone]   being seen today for an initial well woman visit and to discuss family planning options.  She is currently using None for pregnancy prevention. Patient reports she does not want a pregnancy in the next year.  Patient has the following medical conditions has Cervical dysplasia; History of depression; Thyroid activity decreased; Uterine leiomyoma; Dysmenorrhea; Tobacco user; Asthma; Epiglottitis; Tonsillitis; and Migraine with aura and without status migrainosus, not intractable on their problem list.  Chief Complaint  Patient presents with  . Contraception    Physical and birth control    Patient reports that she would like to get on OCs as BCM.  States that she has a history of migraines but that was due to stress and was not worsened on OCs.  Reports that the stress was related to the job that she had at the time and has not had any migraines since she left that job.  Reports that currently she occasionally has headaches that are relieved with OTC analgesics.  States that she did a pregnancy test at home 4 days ago that was negative.  States that she had a pap that was abnormal and then a normal colpo and normal pap smears since then.  States that she was told that she needed her next pap in 2022.    Patient denies any further concerns.   Body mass index is 31.47 kg/m. - Patient is eligible for diabetes screening based on BMI and age >6?  yes HA1C ordered? No, patient declines.  Patient reports 1 partner in last year. Desires STI screening?  No - patient declines.  Has patient been screened once for HCV in the past?  No  No results found for: HCVAB  Does the patient have current drug use (including MJ), have a partner with drug use, and/or has been incarcerated since last result? No   If yes-- Screen for HCV through [facility]   Does the patient meet criteria for HBV testing? No  Criteria:  -Household, sexual or needle sharing contact with HBV -History of drug use -HIV positive -Those with known Hep C   Health Maintenance Due  Topic Date Due  . COVID-19 Vaccine (1) Never done  . TETANUS/TDAP  Never done  . INFLUENZA VACCINE  Never done    Review of Systems  All other systems reviewed and are negative.   The following portions of the patient's history were reviewed and updated as appropriate: allergies, current medications, past family history, past medical history, past social history, past surgical history and problem list. Problem list updated.   See flowsheet for other program required questions.  Objective:   Vitals:   08/30/20 0936  BP: 129/85  Weight: 195 lb (88.5 kg)  Height: 5\' 6"  (1.676 m)    Physical Exam Vitals and nursing note reviewed.  Constitutional:      General: She is not in acute distress.    Appearance: Normal appearance.  HENT:     Head: Normocephalic and atraumatic.     Mouth/Throat:     Mouth: Mucous membranes are moist.     Pharynx: Oropharynx is clear. No oropharyngeal exudate or posterior oropharyngeal erythema.  Eyes:     Conjunctiva/sclera: Conjunctivae normal.  Neck:     Thyroid: No thyroid mass, thyromegaly or thyroid tenderness.  Cardiovascular:  Rate and Rhythm: Normal rate and regular rhythm.  Pulmonary:     Effort: Pulmonary effort is normal.     Breath sounds: Normal breath sounds.  Chest:     Breasts:        Right: Normal. No mass, nipple discharge, skin change or tenderness.        Left: Normal. No mass, nipple discharge, skin change or tenderness.  Abdominal:     Palpations: Abdomen is soft. There is no mass.     Tenderness: There is no abdominal tenderness. There is no guarding or rebound.  Musculoskeletal:     Cervical back: Neck supple. No tenderness.  Lymphadenopathy:     Cervical: No  cervical adenopathy.     Upper Body:     Right upper body: No supraclavicular, axillary or pectoral adenopathy.     Left upper body: No supraclavicular, axillary or pectoral adenopathy.  Skin:    General: Skin is warm and dry.     Findings: No bruising, erythema, lesion or rash.  Neurological:     Mental Status: She is alert and oriented to person, place, and time.  Psychiatric:        Mood and Affect: Mood normal.        Behavior: Behavior normal.        Thought Content: Thought content normal.        Judgment: Judgment normal.       Assessment and Plan:  Heidi Hill is a 39 y.o. female presenting to the [facility] Department for an initial well woman exam/family planning visit  Contraception counseling: Reviewed all forms of birth control options in the tiered based approach. available including abstinence; over the counter/barrier methods; hormonal contraceptive medication including pill, patch, ring, injection,contraceptive implant, ECP; hormonal and nonhormonal IUDs; permanent sterilization options including vasectomy and the various tubal sterilization modalities. Risks, benefits, and typical effectiveness rates were reviewed.  Questions were answered.  Written information was also given to the patient to review.  Patient desires to start OCs, this was prescribed for patient. She will follow up in  6 months and prn for surveillance.  She was told to call with any further questions, or with any concerns about this method of contraception.  Emphasized use of condoms 100% of the time for STI prevention.  Patient was offered ECP. ECP was accepted by the patient. ECP counseling was given.  1. Encounter for counseling regarding contraception Reviewed with patient how to take OCs and when to d/c and call clinic. Enc condoms as back up thru first cycle, if late OCs and always for STD protection.   2. Well woman exam (no gynecological exam) Reviewed with patient healthy  habits to maintain general health. Enc MVI 1 po daily. Enc to establish with/ follow up with PCP for primary care concerns, age appropriate screenings and illness.  3. OCP (oral contraceptive pills) initiation Counseled patient that due to history of migraines, she should monitor for increase in headaches and reappearance of migraines. OK to start Microgestin 28 d #13 1 po QD at the same time daily tomorrow. - norethindrone-ethinyl estradiol (MICROGESTIN) 1-20 MG-MCG tablet; Take 1 tablet by mouth daily.  Dispense: 28 tablet; Refill: 13  4. Encounter for emergency contraceptive counseling and prescription Will give Plan B to take po one time ASAP today. - levonorgestrel (PLAN B ONE-STEP) 1.5 MG tablet; Take 1 tablet (1.5 mg total) by mouth once for 1 dose.  Dispense: 1 tablet; Refill: 0  Return for pill supply prior to running out of birth control pills, annual and PRN.  No future appointments.  Jerene Dilling, PA

## 2020-11-12 ENCOUNTER — Emergency Department
Admission: EM | Admit: 2020-11-12 | Discharge: 2020-11-12 | Disposition: A | Discharge status: Home / Self Care | Payer: Self-pay | Attending: Emergency Medicine | Admitting: Emergency Medicine

## 2020-11-12 ENCOUNTER — Encounter: Payer: Self-pay | Admitting: Emergency Medicine

## 2020-11-12 ENCOUNTER — Emergency Department: Payer: Self-pay

## 2020-11-12 ENCOUNTER — Other Ambulatory Visit: Payer: Self-pay

## 2020-11-12 DIAGNOSIS — J039 Acute tonsillitis, unspecified: Secondary | ICD-10-CM | POA: Insufficient documentation

## 2020-11-12 DIAGNOSIS — Z87891 Personal history of nicotine dependence: Secondary | ICD-10-CM | POA: Insufficient documentation

## 2020-11-12 DIAGNOSIS — R000 Tachycardia, unspecified: Secondary | ICD-10-CM | POA: Insufficient documentation

## 2020-11-12 DIAGNOSIS — R1310 Dysphagia, unspecified: Secondary | ICD-10-CM

## 2020-11-12 DIAGNOSIS — J45909 Unspecified asthma, uncomplicated: Secondary | ICD-10-CM | POA: Insufficient documentation

## 2020-11-12 DIAGNOSIS — Z20822 Contact with and (suspected) exposure to covid-19: Secondary | ICD-10-CM | POA: Insufficient documentation

## 2020-11-12 LAB — BASIC METABOLIC PANEL
Anion gap: 16 — ABNORMAL HIGH (ref 5–15)
BUN: 9 mg/dL (ref 6–20)
CO2: 20 mmol/L — ABNORMAL LOW (ref 22–32)
Calcium: 8.7 mg/dL — ABNORMAL LOW (ref 8.9–10.3)
Chloride: 103 mmol/L (ref 98–111)
Creatinine, Ser: 0.81 mg/dL (ref 0.44–1.00)
GFR, Estimated: >60 mL/min (ref >60)
Glucose, Bld: 101 mg/dL — ABNORMAL HIGH (ref 70–99)
Potassium: 3.7 mmol/L (ref 3.5–5.1)
Sodium: 139 mmol/L (ref 135–145)

## 2020-11-12 LAB — CBC WITH DIFFERENTIAL/PLATELET
Abs Immature Granulocytes: 0.05 10*3/uL (ref 0.00–0.07)
Basophils Absolute: 0 10*3/uL (ref 0.0–0.1)
Basophils Relative: 0 %
Eosinophils Absolute: 0 10*3/uL (ref 0.0–0.5)
Eosinophils Relative: 0 %
HCT: 38.1 % (ref 36.0–46.0)
Hemoglobin: 13.2 g/dL (ref 12.0–15.0)
Immature Granulocytes: 0 %
Lymphocytes Relative: 12 %
Lymphs Abs: 1.8 10*3/uL (ref 0.7–4.0)
MCH: 31.7 pg (ref 26.0–34.0)
MCHC: 34.6 g/dL (ref 30.0–36.0)
MCV: 91.4 fL (ref 80.0–100.0)
Monocytes Absolute: 0.8 10*3/uL (ref 0.1–1.0)
Monocytes Relative: 5 %
Neutro Abs: 12.1 10*3/uL — ABNORMAL HIGH (ref 1.7–7.7)
Neutrophils Relative %: 83 %
Platelets: 255 10*3/uL (ref 150–400)
RBC: 4.17 MIL/uL (ref 3.87–5.11)
RDW: 14.2 % (ref 11.5–15.5)
WBC: 14.8 10*3/uL — ABNORMAL HIGH (ref 4.0–10.5)
nRBC: 0 % (ref 0.0–0.2)

## 2020-11-12 LAB — SARS CORONAVIRUS 2 BY RT PCR (HOSPITAL ORDER, PERFORMED IN ~~LOC~~ HOSPITAL LAB): SARS Coronavirus 2: NEGATIVE

## 2020-11-12 LAB — LACTIC ACID, PLASMA: Lactic Acid, Venous: 1.6 mmol/L (ref 0.5–1.9)

## 2020-11-12 MED ORDER — MORPHINE SULFATE (PF) 4 MG/ML IV SOLN
4.0000 mg | Freq: Once | INTRAVENOUS | Status: AC
  Administered 2020-11-12: 4 mg via INTRAVENOUS
  Filled 2020-11-12: qty 1

## 2020-11-12 MED ORDER — PREDNISONE 10 MG PO TABS
ORAL_TABLET | ORAL | 0 refills | Status: DC
Start: 2020-11-12 — End: 2020-12-30

## 2020-11-12 MED ORDER — SODIUM CHLORIDE 0.9 % IV SOLN
2.0000 g | Freq: Once | INTRAVENOUS | Status: AC
  Administered 2020-11-12: 2 g via INTRAVENOUS
  Filled 2020-11-12: qty 20

## 2020-11-12 MED ORDER — DEXAMETHASONE SODIUM PHOSPHATE 10 MG/ML IJ SOLN
10.0000 mg | Freq: Once | INTRAMUSCULAR | Status: AC
  Administered 2020-11-12: 10 mg via INTRAVENOUS

## 2020-11-12 MED ORDER — NAPROXEN 500 MG PO TABS: 500.0000 mg | ORAL_TABLET | Freq: Two times a day (BID) | ORAL | 0 refills | Status: DC

## 2020-11-12 MED ORDER — LACTATED RINGERS IV BOLUS
1000.0000 mL | Freq: Once | INTRAVENOUS | Status: AC
  Administered 2020-11-12: 1000 mL via INTRAVENOUS

## 2020-11-12 MED ORDER — AMOXICILLIN-POT CLAVULANATE 875-125 MG PO TABS: 1.0000 | ORAL_TABLET | Freq: Two times a day (BID) | ORAL | 0 refills | Status: AC

## 2020-11-12 MED ORDER — KETOROLAC TROMETHAMINE 30 MG/ML IJ SOLN
15.0000 mg | Freq: Once | INTRAMUSCULAR | Status: AC
  Administered 2020-11-12: 15 mg via INTRAVENOUS
  Filled 2020-11-12: qty 1

## 2020-11-12 MED ORDER — IOHEXOL 300 MG/ML  SOLN
75.0000 mL | Freq: Once | INTRAMUSCULAR | Status: AC | PRN
  Administered 2020-11-12: 75 mL via INTRAVENOUS

## 2020-11-12 NOTE — ED Triage Notes (Signed)
Pt to ED from home c/o throat swelling.  States had sore throat yesterday which worsened.  Pt states difficulty swallowing saliva, hoarse muffled voice, pain in throat, no obvious tongue swelling, diffuse swelling around neck.  Pt A&Ox4, chest rise even and unlabored.

## 2020-11-12 NOTE — ED Provider Notes (Signed)
[facility] Emergency Department Provider Note ____________________________________________   Event Date/Time   First MD Initiated Contact with Patient 11/12/20 0542     (approximate)  I have reviewed the triage vital signs and the nursing notes.  HISTORY  Chief Complaint Oral Swelling   HPI Heidi Hill is a 40 y.o. femalewho presents to the ED for evaluation of oropharyngeal swelling  Chart review indicates 03/2020 medical admission here for epiglottitis.  No peritonsillar abscess.  Managed with IV antibiotics and steroids.  Patient reports 2 days of sore throat progressed with worsening with muffled voice, difficulty swallowing and swelling around her neck/mandible.  Reports associated odynophagia.  She reports this feels similar to when she was admitted in June.  She indicates that she never followed up with ENT after that admission.  History somewhat limited due to her hesitancy speaking due to her odynophagia and muffled voice.   Past Medical History:  Diagnosis Date  . Asthma   . Fibroids   . GERD (gastroesophageal reflux disease)   . Vaginal Pap smear, abnormal     Patient Active Problem List   Diagnosis Date Noted  . Epiglottitis 03/21/2020  . Tonsillitis 03/21/2020  . Migraine with aura and without status migrainosus, not intractable 03/09/2019  . Asthma 09/01/2018  . Dysmenorrhea 08/26/2016  . Tobacco user 08/26/2016  . Cervical dysplasia 12/19/2015  . History of depression 11/09/2013  . Thyroid activity decreased 08/29/2013  . Uterine leiomyoma 08/29/2013    Past Surgical History:  Procedure Laterality Date  . COLPOSCOPY W/ BIOPSY / CURETTAGE  12/2015    Prior to Admission medications   Medication Sig Start Date End Date Taking? Authorizing Provider  acetaminophen (TYLENOL) 325 MG tablet Take 2 tablets (650 mg total) by mouth every 6 (six) hours as needed for mild pain, moderate pain, fever or headache (or Fever >/=  101). Patient not taking: Reported on 08/30/2020 03/22/20   Wyvonnia Dusky, MD  albuterol (VENTOLIN HFA) 108 (90 Base) MCG/ACT inhaler Inhale 1 puff into the lungs every 6 (six) hours as needed for wheezing or shortness of breath. Pt reports taking as needed.    [provider]  brompheniramine-pseudoephedrine-DM 30-2-10 MG/5ML syrup Take 5 mLs by mouth 4 (four) times daily as needed. Patient not taking: Reported on 08/30/2020 05/08/20   Sable Feil, PA-C  ibuprofen (ADVIL) 600 MG tablet Take 1 tablet (600 mg total) by mouth every 8 (eight) hours as needed. Patient not taking: Reported on 08/30/2020 05/08/20   Sable Feil, PA-C  norethindrone-ethinyl estradiol (MICROGESTIN) 1-20 MG-MCG tablet Take 1 tablet by mouth daily. 08/30/20   Jerene Dilling, PA  omeprazole (PRILOSEC) 20 MG capsule Take 20 mg by mouth.     [provider]    Allergies Patient has no known allergies.  Family History  Problem Relation Age of Onset  . Diabetes Mother     Social History Social History   Tobacco Use  . Smoking status: Former Smoker    Packs/day: 0.25    Types: Cigarettes  . Smokeless tobacco: Never Used  Substance Use Topics  . Alcohol use: Not Currently    Alcohol/week: 2.0 standard drinks    Types: 2 Glasses of wine per week    Comment: "i just drink on the weekends"  . Drug use: No    Review of Systems  Constitutional: No fever/chills Eyes: No visual changes. ENT: Positive for sore throat. Cardiovascular: Denies chest pain. Respiratory: Denies cough Gastrointestinal: No abdominal  pain.  No nausea, no vomiting.  No diarrhea.  No constipation. Genitourinary: Negative for dysuria. Musculoskeletal: Negative for back pain. Skin: Negative for rash. Neurological: Negative for headaches, focal weakness or numbness.  ____________________________________________   PHYSICAL EXAM:  VITAL SIGNS: Vitals:   11/12/20 0600 11/12/20 0645  BP: 134/83   Pulse: (!)  110 99  Resp: 15   Temp:    SpO2: 95% 96%     Constitutional: Alert and oriented.  Appears uncomfortable.  Hot potato voice.  Spitting secretions Eyes: Conjunctivae are normal. PERRL. EOMI. Head: Atraumatic. Nose: No congestion/rhinnorhea. Mouth/Throat: Mucous membranes are moist.  Oropharynx is diffusely erythematous. Neck: No stridor. No cervical spine tenderness to palpation. Cardiovascular: Tachycardic rate, regular rhythm. Grossly normal heart sounds.  Good peripheral circulation. Respiratory: Normal respiratory effort.  No retractions. Lungs CTAB. Gastrointestinal: Soft , nondistended, nontender to palpation. No CVA tenderness. Musculoskeletal: No lower extremity tenderness nor edema.  No joint effusions. No signs of acute trauma. Neurologic:  Normal speech and language. No gross focal neurologic deficits are appreciated. No gait instability noted. Skin:  Skin is warm, dry and intact. No rash noted. Psychiatric: Mood and affect are normal. Speech and behavior are normal.  ____________________________________________   LABS (all labs ordered are listed, but only abnormal results are displayed)  Labs Reviewed  BASIC METABOLIC PANEL - Abnormal; Notable for the following components:      Result Value   CO2 20 (*)    Glucose, Bld 101 (*)    Calcium 8.7 (*)    Anion gap 16 (*)    All other components within normal limits  CBC WITH DIFFERENTIAL/PLATELET - Abnormal; Notable for the following components:   WBC 14.8 (*)    Neutro Abs 12.1 (*)    All other components within normal limits  SARS CORONAVIRUS 2 BY RT PCR (HOSPITAL ORDER, [place] [facility])  LACTIC ACID, PLASMA    ____________________________________________  RADIOLOGY  ED MD interpretation: CT soft tissue neck pending the time of signout  Official radiology report(s): No results found.  ____________________________________________   PROCEDURES and INTERVENTIONS  Procedure(s) performed  (including Critical Care):  .1-3 Lead EKG Interpretation Performed by: Vladimir Crofts, MD Authorized by: Vladimir Crofts, MD     Interpretation: abnormal     ECG rate:  110   ECG rate assessment: tachycardic     Rhythm: sinus tachycardia     Ectopy: none     Conduction: normal      Medications  lactated ringers bolus 1,000 mL (has no administration in time range)  dexamethasone (DECADRON) injection 10 mg (10 mg Intravenous Given 11/12/20 0540)  cefTRIAXone (ROCEPHIN) 2 g in sodium chloride 0.9 % 100 mL IVPB (0 g Intravenous Stopped 11/12/20 0649)  morphine 4 MG/ML injection 4 mg (4 mg Intravenous Given 11/12/20 0613)  iohexol (OMNIPAQUE) 300 MG/ML solution 75 mL (75 mLs Intravenous Contrast Given 11/12/20 0657)    ____________________________________________   MDM / ED COURSE   40 year old woman with history of epiglottitis who never followed up with ENT for tonsillectomy presents to the ED with odynophagia and sore throat.  Tachycardic, but hemodynamically stable and not hypoxic on room air.  Exam initially with a classic hot potato voice and drooling, rapidly improving after Decadron administration.  Blood work with leukocytosis without lactic acidosis.  Resolution of tachycardia after IV fluids.  We will empirically provide Rocephin due to her history of epiglottitis 7 months ago.  Patient signed out to oncoming provider to follow-up  on her clinical status and CT imaging.  If she continues to improve, tolerating p.o. intake and with a normal voice, without PTA on imaging, it would be reasonable for outpatient management with close ENT follow-up.  Otherwise, she will likely require inpatient stay for IV antibiotics and steroids.   Clinical Course as of 11/12/20 0710  Tue Nov 12, 2020  0240 Reassessed.  Patient reports improving symptoms.  Her voice does seem improved, she is more readily speaking than upon presentation.  Patient signed out to oncoming provider to follow-up on CT imaging and  likely facilitate inpatient admission pending these results and clinical course [DS]    Clinical Course User Index [DS] Vladimir Crofts, MD    ____________________________________________   FINAL CLINICAL IMPRESSION(S) / ED DIAGNOSES  Final diagnoses:  Tonsillitis  Odynophagia     ED Discharge Orders    None       Kipper Buch Tamala Julian   Note:  This document was prepared using Dragon voice recognition software and may include unintentional dictation errors.   Vladimir Crofts, MD 11/12/20 [phone]

## 2020-11-12 NOTE — ED Provider Notes (Signed)
Patient received in signout from Dr. Tamala Julian.  CT imaging consistent with tonsillitis.  Patient feeling significantly improved after IV Toradol fluids and medication.  No trismus,  No muffled voice. She is tolerating p.o.  I do believe she stable and appropriate for outpatient follow-up and referral to ENT.     Merlyn Lot, MD 11/12/20 [phone]

## 2020-11-12 NOTE — ED Notes (Signed)
Patient transported to CT 

## 2020-12-30 ENCOUNTER — Encounter: Payer: Self-pay | Admitting: Emergency Medicine

## 2020-12-30 ENCOUNTER — Other Ambulatory Visit: Payer: Self-pay

## 2020-12-30 ENCOUNTER — Ambulatory Visit: Payer: Self-pay

## 2020-12-30 ENCOUNTER — Ambulatory Visit
Admission: EM | Admit: 2020-12-30 | Discharge: 2020-12-30 | Disposition: A | Discharge status: Home / Self Care | Payer: Self-pay | Attending: Sports Medicine | Admitting: Sports Medicine

## 2020-12-30 DIAGNOSIS — J020 Streptococcal pharyngitis: Secondary | ICD-10-CM | POA: Insufficient documentation

## 2020-12-30 DIAGNOSIS — J351 Hypertrophy of tonsils: Secondary | ICD-10-CM | POA: Insufficient documentation

## 2020-12-30 DIAGNOSIS — R196 Halitosis: Secondary | ICD-10-CM | POA: Insufficient documentation

## 2020-12-30 DIAGNOSIS — R300 Dysuria: Secondary | ICD-10-CM | POA: Insufficient documentation

## 2020-12-30 LAB — GROUP A STREP BY PCR: Group A Strep by PCR: DETECTED — AB

## 2020-12-30 LAB — URINALYSIS, COMPLETE (UACMP) WITH MICROSCOPIC
Glucose, UA: NEGATIVE mg/dL
Ketones, ur: NEGATIVE mg/dL
Leukocytes,Ua: NEGATIVE
Nitrite: NEGATIVE
Protein, ur: 30 mg/dL — AB
Specific Gravity, Urine: >1.03 — ABNORMAL HIGH (ref 1.005–1.030)
pH: 5.5 (ref 5.0–8.0)

## 2020-12-30 MED ORDER — PREDNISONE 10 MG PO TABS: ORAL_TABLET | ORAL | 0 refills | Status: DC

## 2020-12-30 MED ORDER — AMOXICILLIN 500 MG PO CAPS: 500.0000 mg | ORAL_CAPSULE | Freq: Three times a day (TID) | ORAL | 0 refills | Status: DC

## 2020-12-30 NOTE — ED Triage Notes (Signed)
Pt c/o sore throat, fever and tonsil swelling. Started last night. Pt also c/o dysuria. Started about 2 days ago.

## 2020-12-30 NOTE — Discharge Instructions (Addendum)
Your strep test is positive.  I sent in an antibiotic and steroids.  Please take them as directed. Educational handouts provided. Regarding her urine I sent it off for urine culture. Over-the-counter meds such as Tylenol or Motrin for fever or discomfort. Give you a work note you can return to work on March 16 if you are doing better. We discussed what to look for in terms of a peritonsillar abscess.  If you cannot open your mouth, the sore throat gets worse, your fever is uncontrollable, you need to go to the emergency room and be evaluated.

## 2021-01-02 NOTE — ED Provider Notes (Signed)
[facility]    CSN: [id number] Arrival date & time: 12/30/20  1555      History   Chief Complaint Chief Complaint  Patient presents with  . Sore Throat  . Dysuria    HPI Heidi Hill is a 40 y.o. female.   Patient pleasant 40 year old female who presents for evaluation of the above issues.  The first issue is a significant sore throat with difficulty swallowing and some swollen tonsils.  She has had strep exposure.  Her daughter had strep recently.  She denies any COVID exposure.  She did have COVID back in September 2021.  She has not been vaccinated against COVID or influenza.  She works at [employer] down and was not able to go to work today due to the pain in her throat.  She is also complaining of a lot of fatigue from the pain and the fact that she really is having a hard time eating and drinking.  She denies any chest pain shortness of breath or any upper respiratory symptoms.  Her second issue is pain on urination.  This began 2 days ago.  She has increased frequency, increased urgency, and incomplete voiding each time that she goes to the bathroom.  The pain is a burning sensation.  She denies flank pain.  She denies any vaginal discharge.  No red flag signs or symptoms are elicited on history.     Past Medical History:  Diagnosis Date  . Asthma   . Fibroids   . GERD (gastroesophageal reflux disease)   . Vaginal Pap smear, abnormal     Patient Active Problem List   Diagnosis Date Noted  . Epiglottitis 03/21/2020  . Tonsillitis 03/21/2020  . Migraine with aura and without status migrainosus, not intractable 03/09/2019  . Asthma 09/01/2018  . Dysmenorrhea 08/26/2016  . Tobacco user 08/26/2016  . Cervical dysplasia 12/19/2015  . History of depression 11/09/2013  . Thyroid activity decreased 08/29/2013  . Uterine leiomyoma 08/29/2013    Past Surgical History:  Procedure Laterality Date  . COLPOSCOPY W/ BIOPSY / CURETTAGE  12/2015    OB History     Gravida  3   Para  1   Term      Preterm  1   AB  2   Living  1     SAB  1   IAB      Ectopic      Multiple      Live Births  1            Home Medications    Prior to Admission medications   Medication Sig Start Date End Date Taking? Authorizing Provider  amoxicillin (AMOXIL) 500 MG capsule Take 1 capsule (500 mg total) by mouth 3 (three) times daily. 12/30/20  Yes Verda Cumins, MD  norethindrone-ethinyl estradiol (MICROGESTIN) 1-20 MG-MCG tablet Take 1 tablet by mouth daily. 08/30/20  Yes Hampton, Burman Blacksmith, PA  omeprazole (PRILOSEC) 20 MG capsule Take 20 mg by mouth.    Yes [provider]  acetaminophen (TYLENOL) 325 MG tablet Take 2 tablets (650 mg total) by mouth every 6 (six) hours as needed for mild pain, moderate pain, fever or headache (or Fever >/= 101). Patient not taking: Reported on 08/30/2020 03/22/20   Wyvonnia Dusky, MD  albuterol (VENTOLIN HFA) 108 (90 Base) MCG/ACT inhaler Inhale 1 puff into the lungs every 6 (six) hours as needed for wheezing or shortness of breath. Pt reports taking as needed.  [provider]  brompheniramine-pseudoephedrine-DM 30-2-10 MG/5ML syrup Take 5 mLs by mouth 4 (four) times daily as needed. Patient not taking: Reported on 08/30/2020 05/08/20   Sable Feil, PA-C  ibuprofen (ADVIL) 600 MG tablet Take 1 tablet (600 mg total) by mouth every 8 (eight) hours as needed. Patient not taking: Reported on 08/30/2020 05/08/20   Sable Feil, PA-C  naproxen (NAPROSYN) 500 MG tablet Take 1 tablet (500 mg total) by mouth 2 (two) times daily with a meal. 11/12/20 11/12/21  Merlyn Lot, MD  predniSONE (DELTASONE) 10 MG tablet Day 1-2: Take 50mg (5 pills) Day 3-4: Take 40mg (4) Day 5-6: 30mg (3) Day 7-8: 20mg (2) Day 9:10mg (1 12/30/20   Verda Cumins, MD    Family History Family History  Problem Relation Age of Onset  . Diabetes Mother     Social History Social History   Tobacco Use  . Smoking  status: Former Smoker    Packs/day: 0.25    Types: Cigarettes  . Smokeless tobacco: Never Used  Vaping Use  . Vaping Use: Never used  Substance Use Topics  . Alcohol use: Not Currently    Alcohol/week: 2.0 standard drinks    Types: 2 Glasses of wine per week    Comment: "i just drink on the weekends"  . Drug use: No     Allergies   Patient has no known allergies.   Review of Systems Review of Systems  Constitutional: Positive for activity change, appetite change, diaphoresis, fatigue and fever. Negative for chills.  HENT: Positive for sore throat and trouble swallowing. Negative for congestion, ear pain, postnasal drip, rhinorrhea, sinus pressure, sinus pain and sneezing.   Eyes: Negative.   Respiratory: Negative for apnea, cough, chest tightness, shortness of breath, wheezing and stridor.   Cardiovascular: Negative for chest pain and palpitations.  Gastrointestinal: Negative for abdominal pain, constipation, diarrhea, nausea and vomiting.  Genitourinary: Positive for dysuria, frequency and urgency. Negative for flank pain, hematuria, pelvic pain, vaginal bleeding, vaginal discharge and vaginal pain.  Musculoskeletal: Negative.  Negative for arthralgias, back pain and myalgias.  Skin: Negative for color change, rash and wound.  Neurological: Negative for dizziness, syncope, weakness, light-headedness, numbness and headaches.  All other systems reviewed and are negative.    Physical Exam Triage Vital Signs ED Triage Vitals  Enc Vitals Group     BP 12/30/20 1710 131/69     Pulse Rate 12/30/20 1710 99     Resp 12/30/20 1710 18     Temp 12/30/20 1710 99.5 F (37.5 C)     Temp Source 12/30/20 1710 Oral     SpO2 12/30/20 1710 97 %     Weight 12/30/20 1708 195 lb 1.7 oz (88.5 kg)     Height 12/30/20 1708 5\' 7"  (1.702 m)     Head Circumference --      Peak Flow --      Pain Score 12/30/20 1708 9     Pain Loc --      Pain Edu? --      Excl. in [place]? --    No data  found.  Updated Vital Signs BP 131/69 (BP Location: Left Arm)   Pulse 99   Temp 99.5 F (37.5 C) (Oral)   Resp 18   Ht 5\' 7"  (1.702 m)   Wt 88.5 kg   LMP 12/02/2020 (Approximate)   SpO2 97%   BMI 30.56 kg/m   Visual Acuity Right Eye Distance:   Left Eye Distance:   Bilateral Distance:  Right Eye Near:   Left Eye Near:    Bilateral Near:     Physical Exam Vitals and nursing note reviewed.  Constitutional:      General: She is not in acute distress.    Appearance: She is well-developed. She is not ill-appearing, toxic-appearing or diaphoretic.     Comments: She is uncomfortable appearing.  HENT:     Head: Normocephalic and atraumatic.     Right Ear: Tympanic membrane normal.     Left Ear: Tympanic membrane normal.     Nose: No congestion or rhinorrhea.     Mouth/Throat:     Mouth: Mucous membranes are moist. No oral lesions.     Pharynx: Uvula midline. Oropharyngeal exudate and posterior oropharyngeal erythema present. No pharyngeal swelling or uvula swelling.     Tonsils: Tonsillar exudate present. No tonsillar abscesses. 2+ on the right. 2+ on the left.     Comments: No evidence of trismus.  No evidence of peritonsillar abscess.  Patient is able to open her mouth widely although it does cause her some discomfort in her throat.  She does have noticeable halitosis. Eyes:     Extraocular Movements:     Right eye: Normal extraocular motion.     Left eye: Normal extraocular motion.     Conjunctiva/sclera: Conjunctivae normal.     Pupils: Pupils are equal, round, and reactive to light.  Neck:     Thyroid: No thyromegaly.  Cardiovascular:     Rate and Rhythm: Normal rate and regular rhythm.     Heart sounds: Normal heart sounds. No murmur heard. No friction rub. No gallop.   Pulmonary:     Effort: Pulmonary effort is normal. No respiratory distress.     Breath sounds: Normal breath sounds. No stridor. No wheezing, rhonchi or rales.  Abdominal:     General: Bowel  sounds are normal. There is no distension.     Palpations: Abdomen is soft.     Tenderness: There is abdominal tenderness in the suprapubic area. There is no right CVA tenderness, left CVA tenderness, guarding or rebound.  Musculoskeletal:     Cervical back: Normal range of motion and neck supple.  Lymphadenopathy:     Cervical: Cervical adenopathy present.  Skin:    General: Skin is warm and dry.     Capillary Refill: Capillary refill takes less than 2 seconds.     Findings: No erythema or rash.  Neurological:     General: No focal deficit present.     Mental Status: She is alert and oriented to person, place, and time.      UC Treatments / Results  Labs (all labs ordered are listed, but only abnormal results are displayed) Labs Reviewed  GROUP A STREP BY PCR - Abnormal; Notable for the following components:      Result Value   Group A Strep by PCR DETECTED (*)    All other components within normal limits  URINALYSIS, COMPLETE (UACMP) WITH MICROSCOPIC - Abnormal; Notable for the following components:   Color, Urine AMBER (*)    APPearance HAZY (*)    Specific Gravity, Urine >1.030 (*)    Hgb urine dipstick SMALL (*)    Bilirubin Urine SMALL (*)    Protein, ur 30 (*)    Bacteria, UA FEW (*)    All other components within normal limits  URINE CULTURE    EKG   Radiology No results found.  Procedures Procedures (including critical care time)  Medications Ordered  in UC Medications - No data to display  Initial Impression / Assessment and Plan / UC Course  I have reviewed the triage vital signs and the nursing notes.  Pertinent labs & imaging results that were available during my care of the patient were reviewed by me and considered in my medical decision making (see chart for details).   Clinical impression: 1.  Sore throat with tonsillar swelling.  Recent strep exposure.  She has not been vaccinated against COVID but she did get Covid back in September 2021. 2.   Dysuria for 2 days with some tenderness to palpation in the suprapubic area.  No flank pain.  Treatment plan: 1.  The findings and treatment plan were discussed in detail with the patient.  Patient was in agreement. 2.  Recommended we get a strep test.  Results are above.  She is positive for strep throat.  Will treat accordingly. 3.  Recommended getting a UA.  Results are above.  Does not show that she has a significant UTI.  The patient is convinced she does have one so we will send off the culture.  I did indicate to her that the antibiotic that we will choose to treat her strep throat will probably cover any bacteria in her urine.  If the culture does grow something and the antibiotic does not cover anything that grows in her urine someone will contact her and add an antibiotic to her regimen. 4.  Educational handout provided. 5.  Over-the-counter meds such as Tylenol or Motrin for any fever or discomfort. 6.  Given her tonsillar swelling I went ahead and added in a short course of steroids to help her with decreasing her tonsils as well as allowing her to eat and drink a little bit more so she does not get dehydrated. 7.  I gave her a work note keeping her out of work until March 16.  If she is doing fine she can go back to work that day. 8.  I had a long discussion with her and while she does not have any signs of trismus and it does not appear as though she has a peritonsillar abscess I did indicate that it could progress to that point and if it does that she needs to go to the emergency room to be evaluated.  She voiced verbal understanding. 9.  Follow-up here as needed.    Final Clinical Impressions(s) / UC Diagnoses   Final diagnoses:  Strep pharyngitis  Tonsillar hypertrophy  Halitosis  Dysuria     Discharge Instructions     Your strep test is positive.  I sent in an antibiotic and steroids.  Please take them as directed. Educational handouts provided. Regarding her urine I  sent it off for urine culture. Over-the-counter meds such as Tylenol or Motrin for fever or discomfort. Give you a work note you can return to work on March 16 if you are doing better. We discussed what to look for in terms of a peritonsillar abscess.  If you cannot open your mouth, the sore throat gets worse, your fever is uncontrollable, you need to go to the emergency room and be evaluated.   ED Prescriptions    Medication Sig Dispense Auth. Provider   predniSONE (DELTASONE) 10 MG tablet Day 1-2: Take 50mg (5 pills) Day 3-4: Take 40mg (4) Day 5-6: 30mg (3) Day 7-8: 20mg (2) Day 9:10mg (1 30 tablet Verda Cumins, MD   amoxicillin (AMOXIL) 500 MG capsule Take 1 capsule (500 mg total) by mouth 3 (  three) times daily. 21 capsule Verda Cumins, MD     PDMP not reviewed this encounter.   Verda Cumins, MD 01/02/21 [phone]

## 2021-01-03 LAB — URINE CULTURE: Culture: NO GROWTH

## 2021-02-04 ENCOUNTER — Emergency Department: Payer: Commercial Managed Care - PPO

## 2021-02-04 ENCOUNTER — Emergency Department
Admission: EM | Admit: 2021-02-04 | Discharge: 2021-02-04 | Disposition: A | Discharge status: Home / Self Care | Payer: Commercial Managed Care - PPO | Attending: Emergency Medicine | Admitting: Emergency Medicine

## 2021-02-04 ENCOUNTER — Encounter: Payer: Self-pay | Admitting: Emergency Medicine

## 2021-02-04 ENCOUNTER — Other Ambulatory Visit: Payer: Self-pay

## 2021-02-04 DIAGNOSIS — M545 Low back pain, unspecified: Secondary | ICD-10-CM | POA: Diagnosis present

## 2021-02-04 DIAGNOSIS — M25551 Pain in right hip: Secondary | ICD-10-CM | POA: Insufficient documentation

## 2021-02-04 DIAGNOSIS — J45909 Unspecified asthma, uncomplicated: Secondary | ICD-10-CM | POA: Insufficient documentation

## 2021-02-04 DIAGNOSIS — M5431 Sciatica, right side: Secondary | ICD-10-CM

## 2021-02-04 DIAGNOSIS — Z87891 Personal history of nicotine dependence: Secondary | ICD-10-CM | POA: Diagnosis not present

## 2021-02-04 DIAGNOSIS — M5441 Lumbago with sciatica, right side: Secondary | ICD-10-CM | POA: Insufficient documentation

## 2021-02-04 LAB — URINALYSIS, COMPLETE (UACMP) WITH MICROSCOPIC
Bilirubin Urine: NEGATIVE
Glucose, UA: NEGATIVE mg/dL
Ketones, ur: NEGATIVE mg/dL
Leukocytes,Ua: NEGATIVE
Nitrite: NEGATIVE
Protein, ur: NEGATIVE mg/dL
Specific Gravity, Urine: 1.02 (ref 1.005–1.030)
pH: 7 (ref 5.0–8.0)

## 2021-02-04 LAB — POC URINE PREG, ED: Preg Test, Ur: NEGATIVE

## 2021-02-04 MED ORDER — HYDROCODONE-ACETAMINOPHEN 5-325 MG PO TABS: 1.0000 | ORAL_TABLET | Freq: Four times a day (QID) | ORAL | 0 refills | Status: DC | PRN

## 2021-02-04 MED ORDER — KETOROLAC TROMETHAMINE 30 MG/ML IJ SOLN
30.0000 mg | Freq: Once | INTRAMUSCULAR | Status: AC
  Administered 2021-02-04: 30 mg via INTRAMUSCULAR
  Filled 2021-02-04: qty 1

## 2021-02-04 MED ORDER — NAPROXEN 500 MG PO TABS: 500.0000 mg | ORAL_TABLET | Freq: Two times a day (BID) | ORAL | 0 refills | Status: DC

## 2021-02-04 MED ORDER — METHOCARBAMOL 500 MG PO TABS
500.0000 mg | ORAL_TABLET | Freq: Four times a day (QID) | ORAL | 0 refills | Status: DC | PRN
Start: 2021-02-04 — End: 2021-11-10

## 2021-02-04 MED ORDER — METHOCARBAMOL 500 MG PO TABS
1000.0000 mg | ORAL_TABLET | Freq: Once | ORAL | Status: AC
  Administered 2021-02-04: 1000 mg via ORAL
  Filled 2021-02-04: qty 2

## 2021-02-04 NOTE — ED Notes (Signed)
Pt to ED via POV with c/o R leg pain that started last night. Pt denies known injury. Pt states started yesterday, states pain starts in R hip radiates down posterior leg around knee and pain up medial thigh. Pt states increased pain with ambulation/movement. +pedal pulses, skin warm, dry intact, +sensation noted at this time.

## 2021-02-04 NOTE — ED Triage Notes (Signed)
Patient to ER for c/o right sided leg/groin/hip pain.

## 2021-02-04 NOTE — Discharge Instructions (Addendum)
Follow-up with your primary care provider or make an appointment with Dr. Rudene Christians who is the orthopedist on-call if you continue to have problems or worsening of your symptoms.  You may apply ice or heat to your back on the right side as needed for discomfort.  Begin taking medication as directed that was sent to your pharmacy.  The methocarbamol is a muscle relaxant which could cause drowsiness and combination with the hydrocodone.  Do not drive or operate machinery while taking these 2 medications.  Naproxen 500 mg is for inflammation which should help with pain.  This medicine is to be taken every day twice a day with food.

## 2021-02-04 NOTE — ED Notes (Signed)
Pt assisted to the bathroom at this time for urine sample.

## 2021-02-04 NOTE — ED Provider Notes (Signed)
[facility] Emergency Department Provider Note   ____________________________________________   Event Date/Time   First MD Initiated Contact with Patient 02/04/21 [phone]     (approximate)  I have reviewed the triage vital signs and the nursing notes.   HISTORY  Chief Complaint Leg Pain   HPI Heidi Hill is a 40 y.o. female presents with complaint of right leg pain that started yesterday.  Patient is states there is no known injury.  She describes her pain as starting in her right hip and radiates down to the posterior thigh and around the knee.  Patient has increased pain with movement and ambulation.  She rates her pain as an 8 out of 10.       Past Medical History:  Diagnosis Date  . Asthma   . Fibroids   . GERD (gastroesophageal reflux disease)   . Vaginal Pap smear, abnormal     Patient Active Problem List   Diagnosis Date Noted  . Epiglottitis 03/21/2020  . Tonsillitis 03/21/2020  . Migraine with aura and without status migrainosus, not intractable 03/09/2019  . Asthma 09/01/2018  . Dysmenorrhea 08/26/2016  . Tobacco user 08/26/2016  . Cervical dysplasia 12/19/2015  . History of depression 11/09/2013  . Thyroid activity decreased 08/29/2013  . Uterine leiomyoma 08/29/2013    Past Surgical History:  Procedure Laterality Date  . COLPOSCOPY W/ BIOPSY / CURETTAGE  12/2015    Prior to Admission medications   Medication Sig Start Date End Date Taking? Authorizing Provider  HYDROcodone-acetaminophen (NORCO/VICODIN) 5-325 MG tablet Take 1 tablet by mouth every 6 (six) hours as needed for moderate pain. 02/04/21 02/04/22 Yes Katai Marsico L, PA-C  methocarbamol (ROBAXIN) 500 MG tablet Take 1 tablet (500 mg total) by mouth every 6 (six) hours as needed. 02/04/21  Yes Johnn Hai, PA-C  naproxen (NAPROSYN) 500 MG tablet Take 1 tablet (500 mg total) by mouth 2 (two) times daily with a meal. 02/04/21  Yes Letitia Neri L, PA-C   albuterol (VENTOLIN HFA) 108 (90 Base) MCG/ACT inhaler Inhale 1 puff into the lungs every 6 (six) hours as needed for wheezing or shortness of breath. Pt reports taking as needed.    [provider]  norethindrone-ethinyl estradiol (MICROGESTIN) 1-20 MG-MCG tablet Take 1 tablet by mouth daily. 08/30/20   Jerene Dilling, PA  omeprazole (PRILOSEC) 20 MG capsule Take 20 mg by mouth.     [provider]    Allergies Patient has no known allergies.  Family History  Problem Relation Age of Onset  . Diabetes Mother     Social History Social History   Tobacco Use  . Smoking status: Former Smoker    Packs/day: 0.25    Types: Cigarettes  . Smokeless tobacco: Never Used  Vaping Use  . Vaping Use: Never used  Substance Use Topics  . Alcohol use: Not Currently    Alcohol/week: 2.0 standard drinks    Types: 2 Glasses of wine per week    Comment: "i just drink on the weekends"  . Drug use: No    Review of Systems Constitutional: No fever/chills Eyes: No visual changes. Cardiovascular: Denies chest pain. Respiratory: Denies shortness of breath. Gastrointestinal: No abdominal pain.  No nausea, no vomiting.  No diarrhea.  Genitourinary: Negative for dysuria.  Musculoskeletal: Positive for right leg pain posteriorly. Skin: Negative for rash. Neurological: Negative for headaches, focal weakness or numbness. ____________________________________________   PHYSICAL EXAM:  VITAL SIGNS: ED Triage Vitals  Enc Vitals Group  BP 02/04/21 0807 (!) 142/91     Pulse Rate 02/04/21 0807 74     Resp 02/04/21 0807 20     Temp 02/04/21 0807 98.2 F (36.8 C)     Temp Source 02/04/21 0807 Oral     SpO2 02/04/21 0807 96 %     Weight 02/04/21 0810 196 lb 3.4 oz (89 kg)     Height 02/04/21 0810 5\' 7"  (1.702 m)     Head Circumference --      Peak Flow --      Pain Score 02/04/21 0809 8     Pain Loc --      Pain Edu? --      Excl. in [place]? --     Constitutional: Alert and  oriented. Well appearing and in no acute distress. Eyes: Conjunctivae are normal.  Head: Atraumatic. Neck: No stridor.   Cardiovascular: Normal rate, regular rhythm. Grossly normal heart sounds.  Good peripheral circulation. Respiratory: Normal respiratory effort.  No retractions. Lungs CTAB. Gastrointestinal: Soft and nontender. No distention.  Bowel sounds normoactive x4 quadrants.  No CVA tenderness. Musculoskeletal: Examination of the back there is no gross deformity and no tenderness on palpation of the cervical or thoracic spine.  There is some minimal tenderness on palpation of the lower lumbar spine and more in the right SI joint area and surrounding soft tissue.  Straight leg raises in the left causes pain in the right SI joint area.  Patient is unable to tolerate straight leg raises on the right more than 20 degrees. Neurologic:  Normal speech and language. No gross focal neurologic deficits are appreciated. No gait instability. Skin:  Skin is warm, dry and intact. No rash noted. Psychiatric: Mood and affect are normal. Speech and behavior are normal.  ____________________________________________   LABS (all labs ordered are listed, but only abnormal results are displayed)  Labs Reviewed  URINALYSIS, COMPLETE (UACMP) WITH MICROSCOPIC - Abnormal; Notable for the following components:      Result Value   Color, Urine YELLOW (*)    APPearance HAZY (*)    Hgb urine dipstick SMALL (*)    Bacteria, UA FEW (*)    All other components within normal limits  POC URINE PREG, ED     RADIOLOGY I, Johnn Hai, personally viewed and evaluated these images (plain radiographs) as part of my medical decision making, as well as reviewing the written report by the radiologist.   Official radiology report(s): DG Lumbar Spine 2-3 Views  Result Date: 02/04/2021 CLINICAL DATA:  Low back pain extending into the right lower extremity. EXAM: LUMBAR SPINE - 2-3 VIEW COMPARISON:  None.  FINDINGS: Five non rib-bearing lumbar type vertebral bodies are present. Mild leftward curvature is noted. No significant listhesis is present. Vertebral body heights and alignment are normal. Straightening of the normal lumbar lordosis noted. IMPRESSION: 1. 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 2. No acute or focal abnormality. 3. Straightening of the normal lumbar lordosis. This is nonspecific, but can be seen in the setting muscle strain or ongoing pain. Electronically Signed   By: San Morelle M.D.   On: 02/04/2021 10:34    ____________________________________________   PROCEDURES  Procedure(s) performed (including Critical Care):  Procedures   ____________________________________________   INITIAL IMPRESSION / ASSESSMENT AND PLAN / ED COURSE  As part of my medical decision making, I reviewed the following data within the electronic MEDICAL RECORD NUMBER Notes from prior ED visits and [place] Controlled Substance Database  40 year old  female presents to the ED with complaint of pain radiating from her back around her right hip and posteriorly down her right leg to the knee area.  She denies any injury to her back.  Patient states that she finds it painful and difficult to walk.  Patient was given Toradol 30 mg IM and methocarbamol 1000 mg p.o.  Patient was made aware that her urinalysis was negative.  We discussed findings on her x-ray and findings consistent with sciatica.  Patient was discharged with a prescription for methocarbamol, hydrocodone, and naproxen 500 mg twice daily.  Patient is to follow-up with her PCP if any continued problems or not improving.  If any severe worsening of her symptoms she is to return to the emergency department.  ____________________________________________   FINAL CLINICAL IMPRESSION(S) / ED DIAGNOSES  Final diagnoses:  Sciatica of right side  Acute right-sided low back pain with right-sided sciatica     ED Discharge Orders          Ordered    methocarbamol (ROBAXIN) 500 MG tablet  Every 6 hours PRN        02/04/21 1055    naproxen (NAPROSYN) 500 MG tablet  2 times daily with meals        02/04/21 1056    HYDROcodone-acetaminophen (NORCO/VICODIN) 5-325 MG tablet  Every 6 hours PRN        02/04/21 1056          *Please note:  Sherald Balbuena was evaluated in Emergency Department on 02/04/2021 for the symptoms described in the history of present illness. She was evaluated in the context of the global COVID-19 pandemic, which necessitated consideration that the patient might be at risk for infection with the SARS-CoV-2 virus that causes COVID-19. Institutional protocols and algorithms that pertain to the evaluation of patients at risk for COVID-19 are in a state of rapid change based on information released by regulatory bodies including the CDC and federal and state organizations. These policies and algorithms were followed during the patient's care in the ED.  Some ED evaluations and interventions may be delayed as a result of limited staffing during and the pandemic.*   Note:  This document was prepared using Dragon voice recognition software and may include unintentional dictation errors.    Johnn Hai, PA-C 02/04/21 1411    Blake Divine, MD 02/04/21 1536

## 2021-02-04 NOTE — ED Notes (Signed)
Pt taken to Xray at this time.

## 2021-09-04 IMAGING — CT CT NECK W/ CM
3 of 4 series · 12 of 33 positions shown, 14 images · IV contrast (omnipaque)
Comparison: Neck CT 03/21/2020.
COMPARISON: Neck CT 03/21/2020.

Addendum:
CLINICAL DATA: 40-year-old female with throat swelling, difficulty
swallowing saliva, muffled voice. No obvious tongue swelling. Query
epiglottitis.

EXAM:
CT NECK WITH CONTRAST
TECHNIQUE: Multidetector CT imaging of the neck was performed using the
standard protocol following the bolus administration of intravenous
contrast.
CONTRAST:  75mL OMNIPAQUE IOHEXOL 300 MG/ML  SOLN

[Series 5: sag neck · sagittal · 0.51mm/px · 5 of 130 slices shown, 6 images]
[im 44/130  bone]
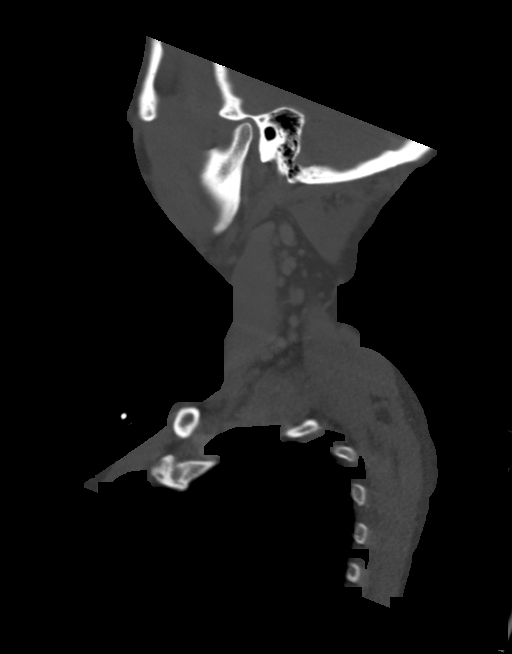
[im 54/130  bone]
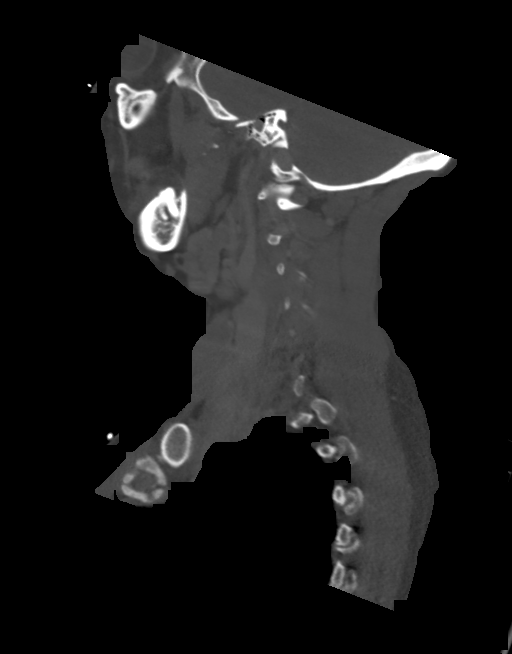
[im 65/130  soft-tissue]
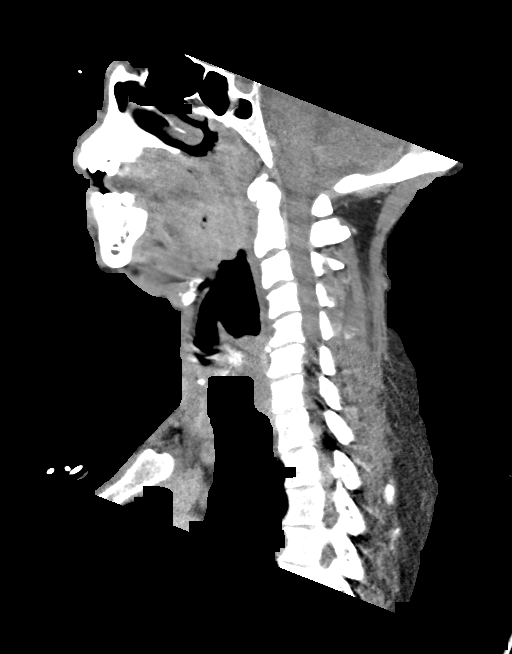
[im 65/130  bone]
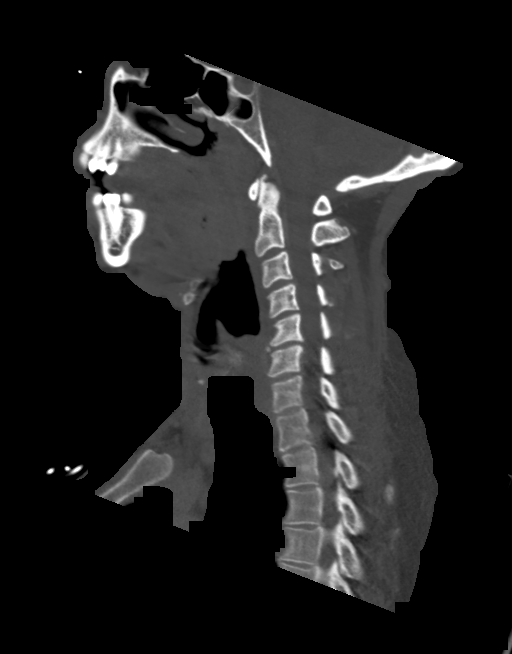
[im 76/130  bone]
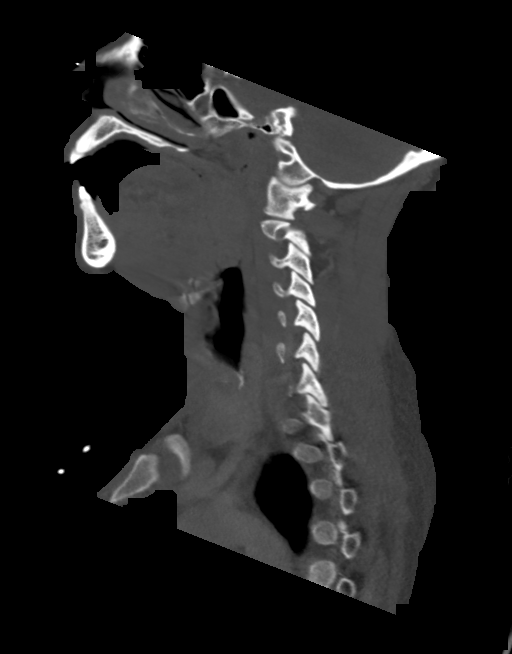
[im 87/130  bone]
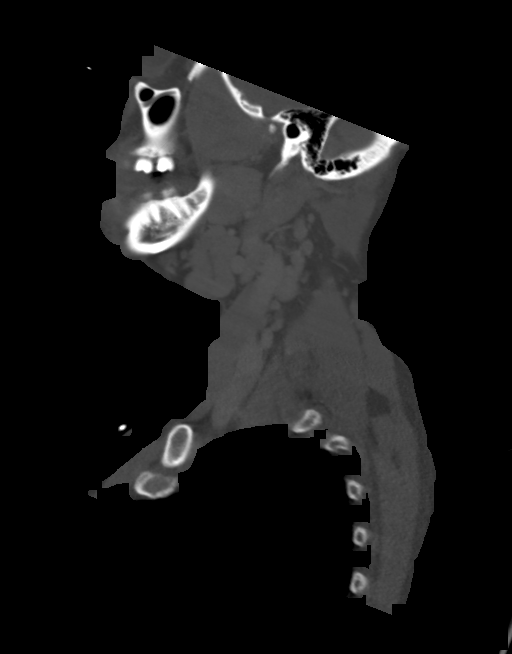

[Series 6: cor neck · coronal · 0.51mm/px · 3 of 96 slices shown]
[im 29/96  bone]
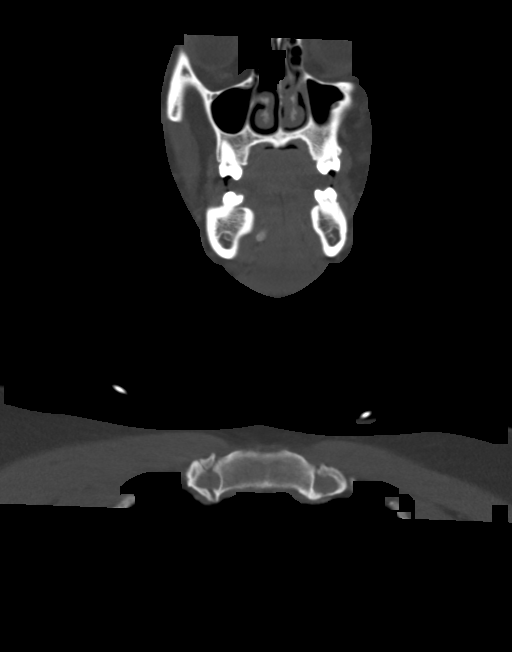
[im 42/96  bone]
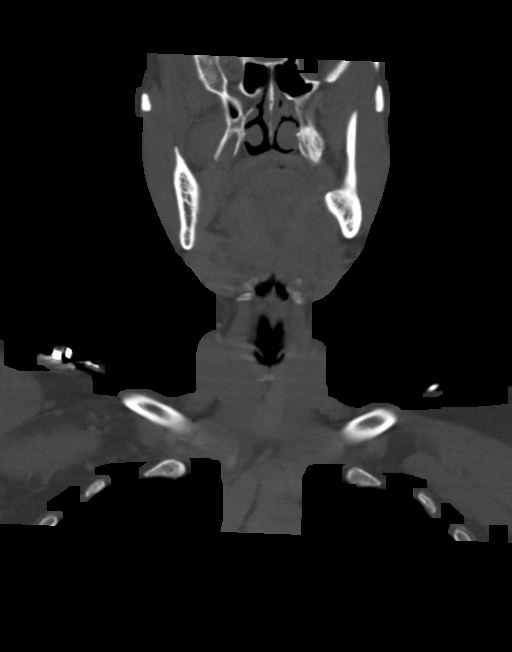
[im 55/96  bone]
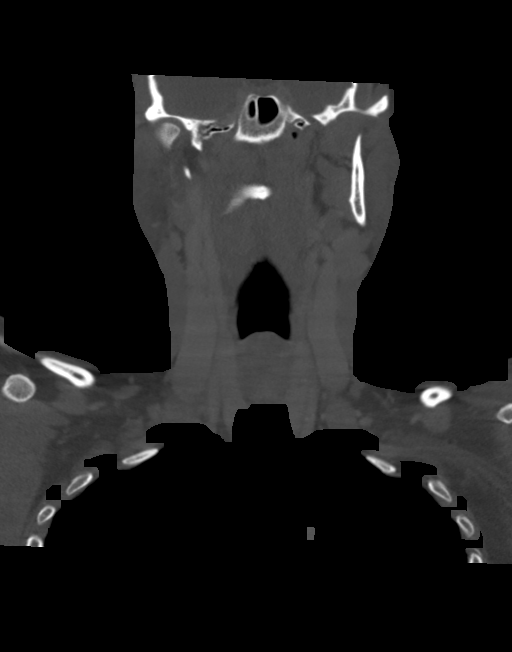

[Series 7: orthogonal ax · axial · 0.51mm/px · z∈[-399,-176]mm · 4 of 169 slices shown, 5 images]
[im 25/169  soft-tissue]
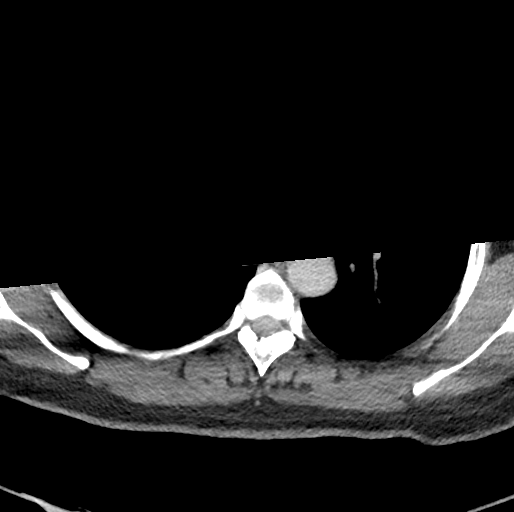
[im 25/169  bone]
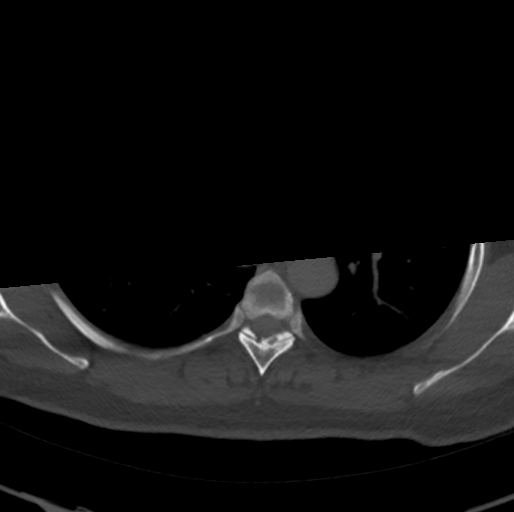
[im 73/169  bone]
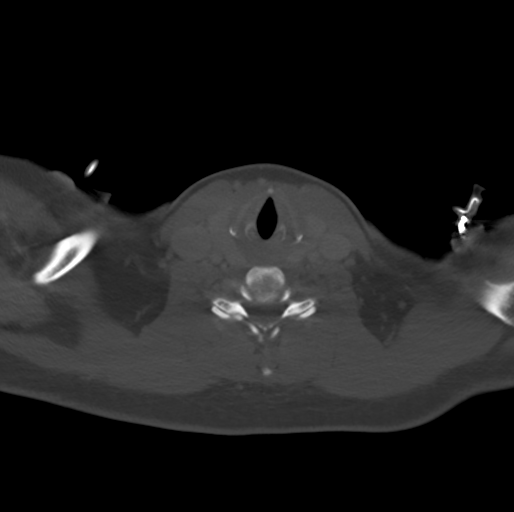
[im 97/169  bone]
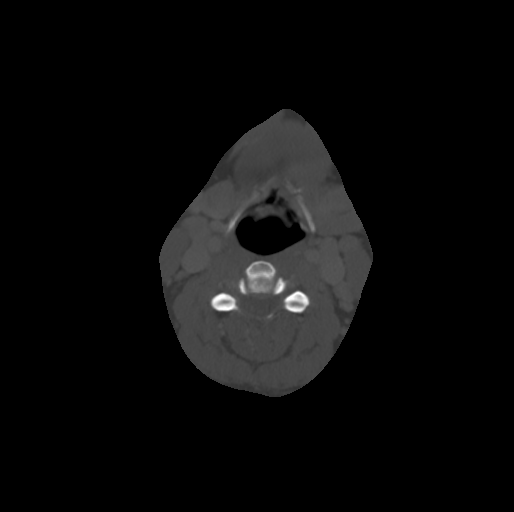
[im 145/169  bone]
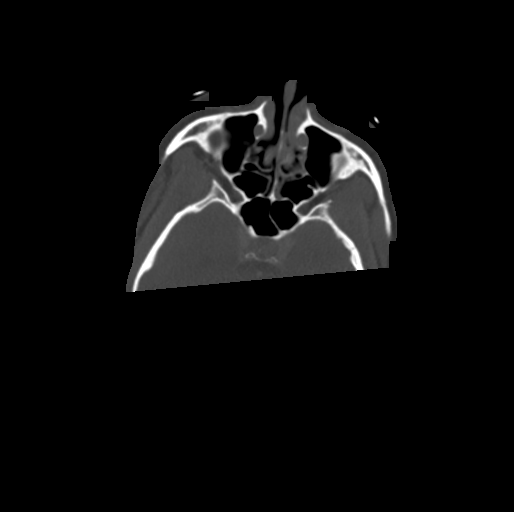

[12 of 33 positions shown; findings below may reference images not displayed]

FINDINGS: Pharynx and larynx: The epiglottis is normal (sagittal image 70) and
smaller from the neck CT last year.

However, the hypopharynx is distended with gas, and there is
generalized tonsillar enlargement and hyperenhancement (series 2,
image 37) with effaced oropharyngeal airway at the soft palate
(series 2, image 34). Mild associated respiratory motion. Retained
secretions in the nasopharynx. Parapharyngeal spaces remain normal,
and there is no discrete tonsillar abscess. Retropharyngeal space
remains normal.

Other laryngeal soft tissue contours are normal.

Salivary glands: Bulky sialolithiasis in the right anterior
sublingual space redemonstrated, individually up to 11 mm. Bilateral
submandibular gland ductal ectasia at the hila again noted.
Asymmetric right submandibular ductal ectasia in the sublingual
space (series 2, image 44), but no acute submandibular gland
inflammation. The right gland does appear mildly hypertrophied
compared to the left.

Parotid glands are within normal limits.

Thyroid: Stable subcentimeter hypodense nodules more so on the left.
No follow-up imaging recommended (ref: [HOSPITAL]. [DATE]): 143-50).

Lymph nodes: Bilateral level 2A lymph nodes have mildly increased in
size and number from last year, measuring up to 12-15 mm (on the
left series 2, image 39). Other lymph node stations are stable and
more normal. No cystic or necrotic lymph nodes identified.

Vascular: Major vascular structures in the neck and at the skull
base appear patent. No atherosclerosis is evident.

Limited intracranial: Negative.

Visualized orbits: Negative.

Mastoids and visualized paranasal sinuses: Stable, clear.

Skeleton: Some carious dentition, posterior left mandible molar, but
no acute dental finding identified. No acute osseous abnormality
identified.

Upper chest: Negative.

Other: None.
IMPRESSION: 1. Generalized Tonsillitis with moderate to severe tonsillar
enlargement and effaced oropharyngeal airway.
But no tonsillar or neck abscess.  Normal larynx.

2. Reactive appearing bilateral level 2 lymph nodes. No cystic or
necrotic nodes.

3. Bulky chronic right submandibular sialolithiasis. No evidence of
acute sialoadenitis.

ADDENDUM:
Study discussed by telephone with Dr. GEORGIANA JIM on 11/12/2020 at
[DATE]. He advises the patient has improved significantly after
initial course of Decadron. Clinically her airway does not seem
threatened now.

*** End of Addendum ***
FINDINGS: Pharynx and larynx: The epiglottis is normal (sagittal image 70) and
smaller from the neck CT last year.

However, the hypopharynx is distended with gas, and there is
generalized tonsillar enlargement and hyperenhancement (series 2,
image 37) with effaced oropharyngeal airway at the soft palate
(series 2, image 34). Mild associated respiratory motion. Retained
secretions in the nasopharynx. Parapharyngeal spaces remain normal,
and there is no discrete tonsillar abscess. Retropharyngeal space
remains normal.

Other laryngeal soft tissue contours are normal.

Salivary glands: Bulky sialolithiasis in the right anterior
sublingual space redemonstrated, individually up to 11 mm. Bilateral
submandibular gland ductal ectasia at the hila again noted.
Asymmetric right submandibular ductal ectasia in the sublingual
space (series 2, image 44), but no acute submandibular gland
inflammation. The right gland does appear mildly hypertrophied
compared to the left.

Parotid glands are within normal limits.

Thyroid: Stable subcentimeter hypodense nodules more so on the left.
No follow-up imaging recommended (ref: [HOSPITAL]. [DATE]): 143-50).

Lymph nodes: Bilateral level 2A lymph nodes have mildly increased in
size and number from last year, measuring up to 12-15 mm (on the
left series 2, image 39). Other lymph node stations are stable and
more normal. No cystic or necrotic lymph nodes identified.

Vascular: Major vascular structures in the neck and at the skull
base appear patent. No atherosclerosis is evident.

Limited intracranial: Negative.

Visualized orbits: Negative.

Mastoids and visualized paranasal sinuses: Stable, clear.

Skeleton: Some carious dentition, posterior left mandible molar, but
no acute dental finding identified. No acute osseous abnormality
identified.

Upper chest: Negative.

Other: None.
IMPRESSION: 1. Generalized Tonsillitis with moderate to severe tonsillar
enlargement and effaced oropharyngeal airway.
But no tonsillar or neck abscess.  Normal larynx.

2. Reactive appearing bilateral level 2 lymph nodes. No cystic or
necrotic nodes.

3. Bulky chronic right submandibular sialolithiasis. No evidence of
acute sialoadenitis.

## 2021-11-10 ENCOUNTER — Emergency Department
Admission: EM | Admit: 2021-11-10 | Discharge: 2021-11-10 | Disposition: A | Discharge status: Home / Self Care | Payer: PRIVATE HEALTH INSURANCE | Attending: Emergency Medicine | Admitting: Emergency Medicine

## 2021-11-10 ENCOUNTER — Other Ambulatory Visit: Payer: Self-pay

## 2021-11-10 ENCOUNTER — Encounter: Payer: Self-pay | Admitting: Emergency Medicine

## 2021-11-10 DIAGNOSIS — R300 Dysuria: Secondary | ICD-10-CM | POA: Insufficient documentation

## 2021-11-10 DIAGNOSIS — R3915 Urgency of urination: Secondary | ICD-10-CM | POA: Insufficient documentation

## 2021-11-10 DIAGNOSIS — R319 Hematuria, unspecified: Secondary | ICD-10-CM | POA: Insufficient documentation

## 2021-11-10 DIAGNOSIS — Z5321 Procedure and treatment not carried out due to patient leaving prior to being seen by health care provider: Secondary | ICD-10-CM | POA: Insufficient documentation

## 2021-11-10 DIAGNOSIS — R350 Frequency of micturition: Secondary | ICD-10-CM | POA: Insufficient documentation

## 2021-11-10 LAB — URINALYSIS, ROUTINE W REFLEX MICROSCOPIC
Glucose, UA: NEGATIVE mg/dL
Ketones, ur: 15 mg/dL — AB
Nitrite: POSITIVE — AB
Protein, ur: >300 mg/dL — AB
RBC / HPF: >50 RBC/hpf — ABNORMAL HIGH (ref 0–5)
Specific Gravity, Urine: 1.025 (ref 1.005–1.030)
Squamous Epithelial / HPF: NONE SEEN (ref 0–5)
WBC, UA: >50 WBC/hpf — ABNORMAL HIGH (ref 0–5)
pH: 5.5 (ref 5.0–8.0)

## 2021-11-10 LAB — POC URINE PREG, ED: Preg Test, Ur: NEGATIVE

## 2021-11-10 NOTE — ED Triage Notes (Signed)
C/O dysuria, urgency, frequency, burning, hematuria x 1 day.

## 2021-11-10 NOTE — ED Provider Triage Note (Signed)
Emergency Medicine Provider Triage Evaluation Note  Heidi Hill, a 41 y.o. female  was evaluated in triage.  Pt complains of dysuria, hematuria, urgency, and frequency. She notes sudden onset of symptoms yesterday. She denies flank pain or fevers.   Review of Systems  Positive: Hematuria, dysuria, urgency Negative: FCS  Physical Exam  BP (!) 167/103    Pulse 89    Temp 98.5 F (36.9 C)    Resp 18    Ht 5\' 7"  (1.702 m)    Wt 89 kg    SpO2 97%    BMI 30.73 kg/m  Gen:   Awake, no distress   Resp:  Normal effort  MSK:   Moves extremities without difficulty  Other:  CVS: RRR  Medical Decision Making  Medically screening exam initiated at 5:27 PM.  Appropriate orders placed.  Heidi Hill was informed that the remainder of the evaluation will be completed by another provider, this initial triage assessment does not replace that evaluation, and the importance of remaining in the ED until their evaluation is complete.  Patient with ED evaluation of sudden onset of gross hematuria, urinary frequency, and urgency.  She denies any associated fevers, chills, sweats at this time.   Heidi Needles, PA-C 11/10/21 1730

## 2021-11-11 ENCOUNTER — Other Ambulatory Visit: Payer: Self-pay

## 2021-11-11 ENCOUNTER — Encounter: Payer: Self-pay | Admitting: Emergency Medicine

## 2021-11-11 ENCOUNTER — Emergency Department
Admission: EM | Admit: 2021-11-11 | Discharge: 2021-11-11 | Disposition: A | Discharge status: Home / Self Care | Payer: PRIVATE HEALTH INSURANCE | Attending: Emergency Medicine | Admitting: Emergency Medicine

## 2021-11-11 DIAGNOSIS — N3000 Acute cystitis without hematuria: Secondary | ICD-10-CM | POA: Insufficient documentation

## 2021-11-11 MED ORDER — CEPHALEXIN 500 MG PO CAPS: 500.0000 mg | ORAL_CAPSULE | Freq: Three times a day (TID) | ORAL | 0 refills | Status: AC

## 2021-11-11 MED ORDER — CEPHALEXIN 500 MG PO CAPS
500.0000 mg | ORAL_CAPSULE | Freq: Once | ORAL | Status: AC
  Administered 2021-11-11: 14:00:00 500 mg via ORAL
  Filled 2021-11-11: qty 1

## 2021-11-11 MED ORDER — PHENAZOPYRIDINE HCL 200 MG PO TABS
200.0000 mg | ORAL_TABLET | Freq: Once | ORAL | Status: DC
  Filled 2021-11-11: qty 1

## 2021-11-11 NOTE — ED Triage Notes (Signed)
Pt to ED via POV with c/o urgency, dysuria, burning, and hematuria for the last two days. She was seen here yesterday and left before being seen because she thought we looked at her My Chart and would call her in a RX. She is back to get an antibiotic.

## 2021-11-11 NOTE — ED Provider Notes (Signed)
[facility] Provider Note  Patient Contact: 2:08 PM (approximate)   History   Urinary Frequency   HPI  Heidi Hill is a 41 y.o. female returns to the ED for ongoing dysuria, frequency, urgency, and hematuria.  Patient presented to the ED yesterday, for evaluation, but apparently left after evaluation.  She assume someone to call her with test results and additionally, an antibiotic.  She returned to the ED today, noting ongoing symptoms, and request for treatment.  She attempted to resent her self to a local urgent care, but her insurance was not accepted at that facility.  She denies any nausea, vomiting, or fevers.  Physical Exam   Triage Vital Signs: ED Triage Vitals  Enc Vitals Group     BP 11/11/21 1404 (!) 145/84     Pulse Rate 11/11/21 1404 85     Resp 11/11/21 1404 16     Temp 11/11/21 1404 98.7 F (37.1 C)     Temp Source 11/11/21 1404 Oral     SpO2 11/11/21 1404 97 %     Weight 11/11/21 1405 220 lb (99.8 kg)     Height 11/11/21 1400 5\' 7"  (1.702 m)     Head Circumference --      Peak Flow --      Pain Score 11/11/21 1405 10     Pain Loc --      Pain Edu? --      Excl. in [place]? --     Most recent vital signs: Vitals:   11/11/21 1404 11/11/21 1428  BP: (!) 145/84 (!) 145/84  Pulse: 85 85  Resp: 16 20  Temp: 98.7 F (37.1 C) 98.7 F (37.1 C)  SpO2: 97% 97%     General: Alert and in no acute distress. Cardiovascular:  Good peripheral perfusion Respiratory: Normal respiratory effort without tachypnea or retractions. Lungs CTAB.  Gastrointestinal: Bowel sounds 4 quadrants. Soft and nontender to palpation. No guarding or rigidity. No palpable masses. No distention. No CVA tenderness. Musculoskeletal: Full range of motion to all extremities.  Neurologic:  No gross focal neurologic deficits are appreciated.  Skin:   No rash noted Other:   ED Results / Procedures / Treatments   Labs (all labs ordered are listed, but only  abnormal results are displayed) Labs Reviewed  URINE CULTURE     EKG   RADIOLOGY  No results found.  PROCEDURES:  Critical Care performed: No  Procedures   MEDICATIONS ORDERED IN ED: Medications  cephALEXin (KEFLEX) capsule 500 mg (500 mg Oral Given 11/11/21 1425)     IMPRESSION / MDM / ASSESSMENT AND PLAN / ED COURSE  I reviewed the triage vital signs and the nursing notes.                              Differential diagnosis includes, but is not limited to, cystitis, kidney stones, pyelonephritis  Patient with subsequent ED evaluation for dysuria, hematuria, urinary frequency with symptoms consistent with UTI.  Patient's UA which is reviewed from yesterday's lab, does confirm leukocyturia, hematuria, and nitrite positive urine. Patient's will be given empiric dose of Keflex in the ED and discharged with a prescription for the same.  Urine culture ordered on specimen collected yesterday. Patient is to follow up with this ED a local urgent care for needed or otherwise directed. Patient is given ED precautions to return to the ED for any worsening or new symptoms.  -----------------------------------------  8:21 PM on 11/11/2021 ----------------------------------------- I called down to the lab, to confirm and reaffirmed the need for a urine culture to be added on the urine collected yesterday.  Lab tech verify she will pull the specimen and send it out to [facility] for culture as requested.  FINAL CLINICAL IMPRESSION(S) / ED DIAGNOSES   Final diagnoses:  Acute cystitis without hematuria     Rx / DC Orders   ED Discharge Orders          Ordered    cephALEXin (KEFLEX) 500 MG capsule  3 times daily        11/11/21 1423             Note:  This document was prepared using Dragon voice recognition software and may include unintentional dictation errors.    Melvenia Needles, PA-C 11/11/21 2022    Carrie Mew, MD 11/12/21 1539

## 2021-11-11 NOTE — Discharge Instructions (Addendum)
°  Take antibiotic as directed and Pyridium as prescribed.  Continue to hydrate and if your bladder on schedule.  Follow-up with [facility] for ongoing symptoms.  Return to the ED if needed.

## 2021-11-14 LAB — URINE CULTURE: Culture: >=100000 — AB

## 2021-11-27 IMAGING — CR DG LUMBAR SPINE 2-3V
3 series · 3 of 3 positions shown · non-contrast
Comparison: None.

CLINICAL DATA: Low back pain extending into the right lower
extremity.

EXAM:
LUMBAR SPINE - 2-3 VIEW

[l-spine ap]
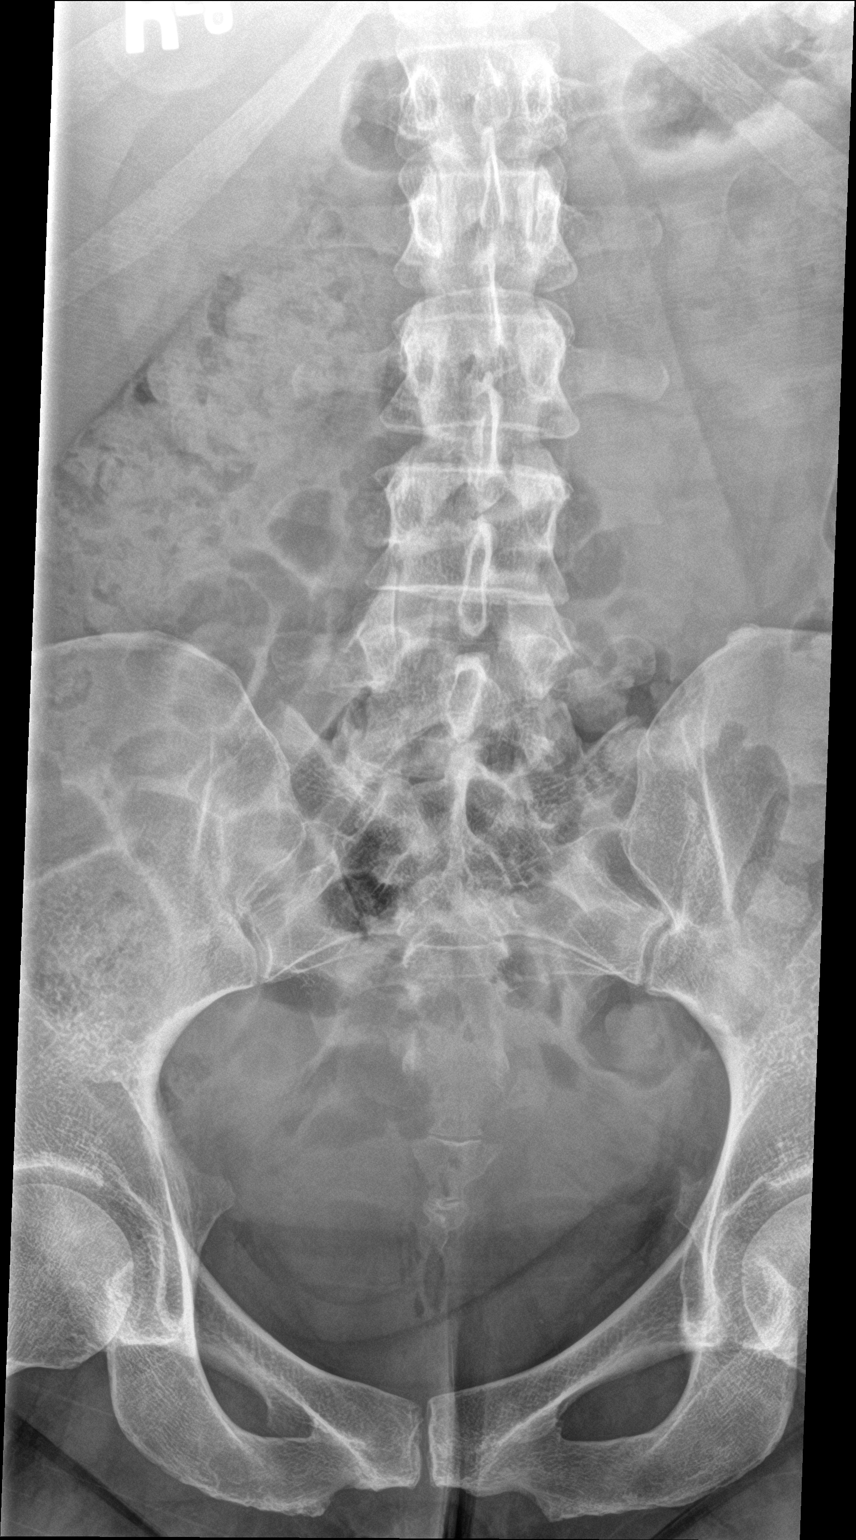

[l-spine lat]
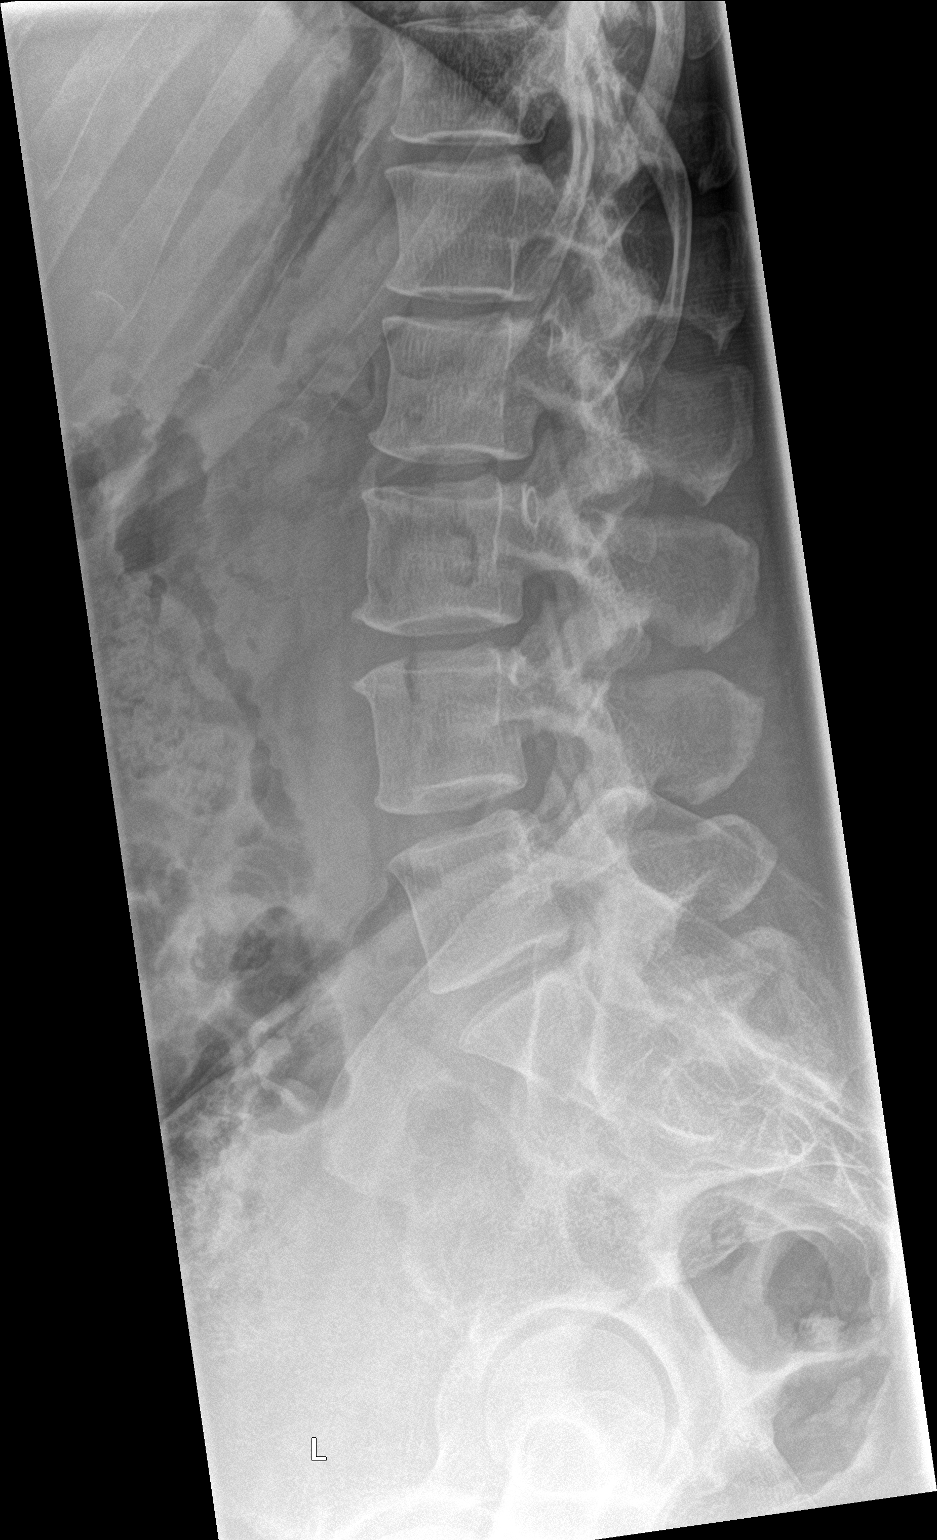

[l-spine spot]
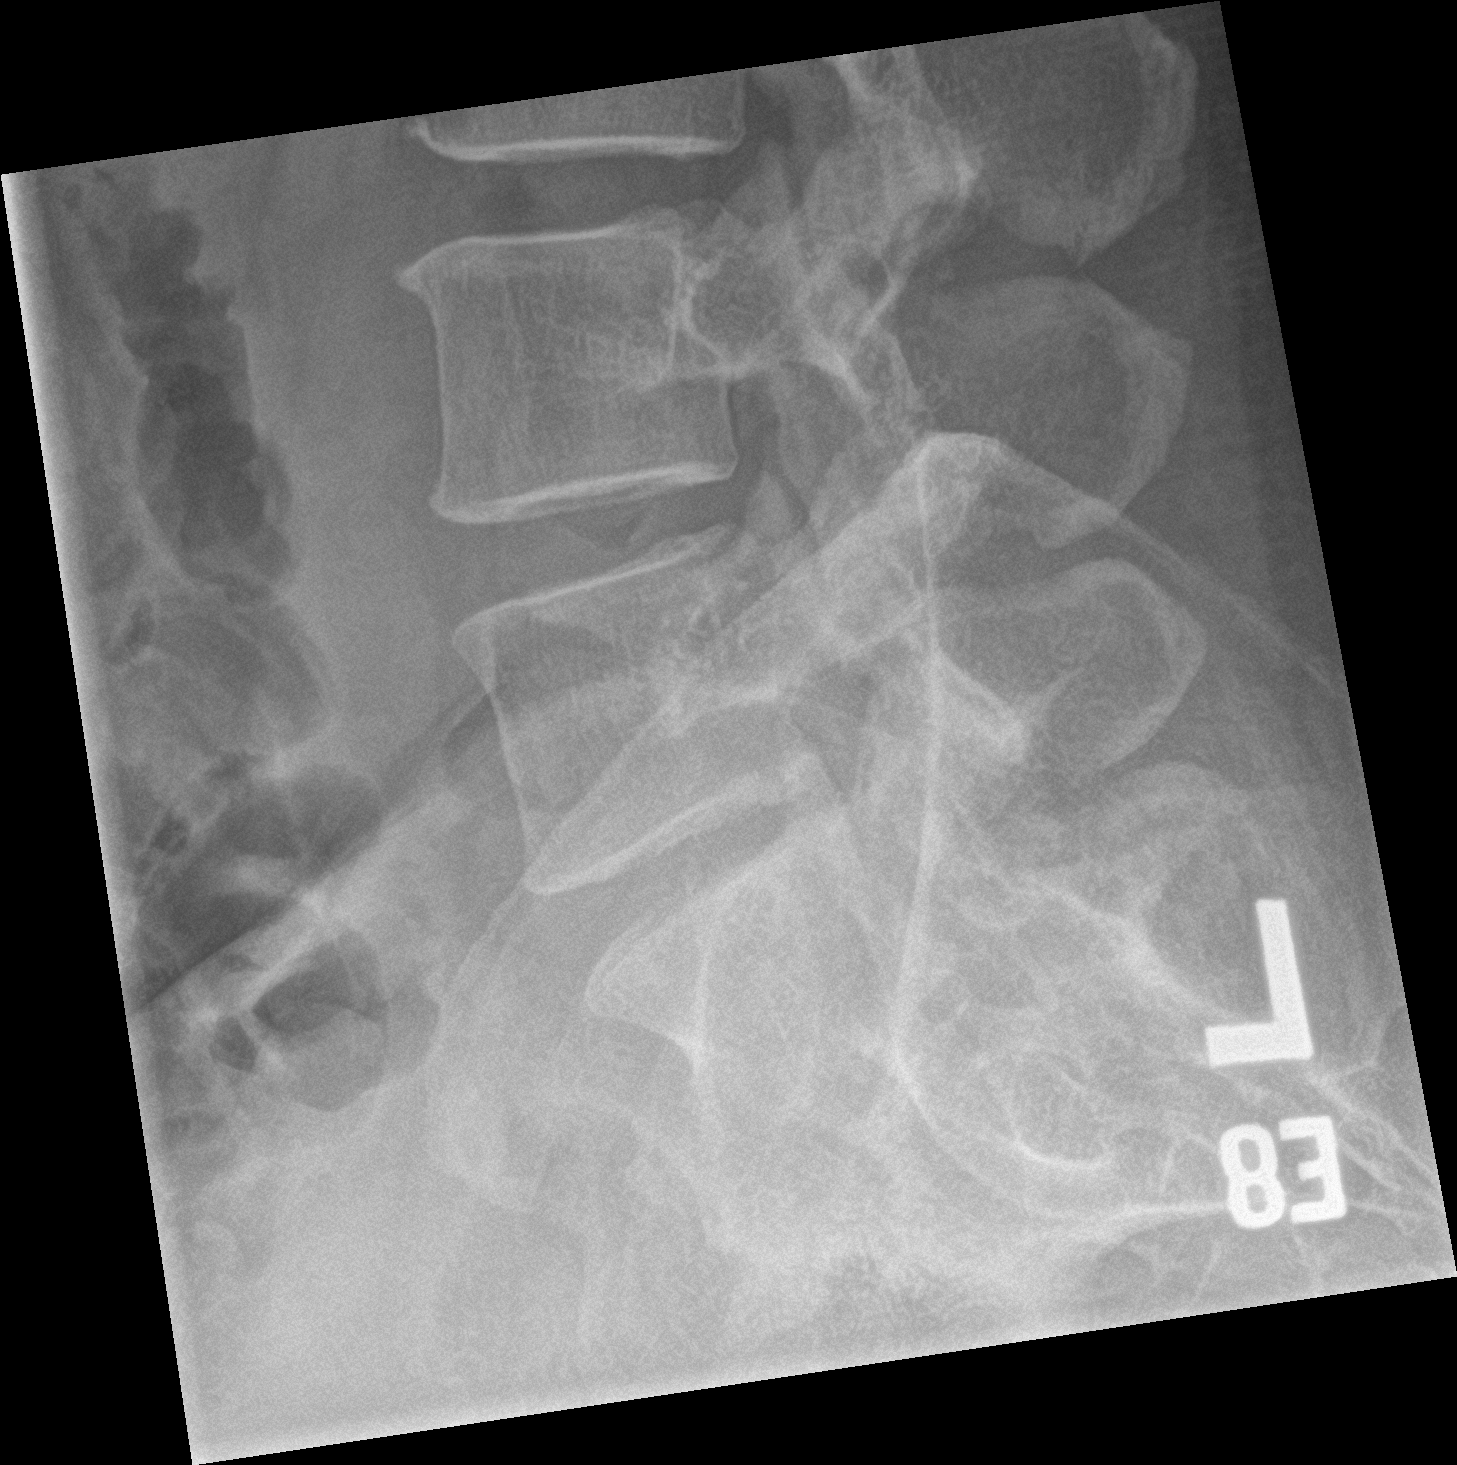

[3 of 3 positions shown; findings below may reference images not displayed]

FINDINGS: Five non rib-bearing lumbar type vertebral bodies are present. Mild
leftward curvature is noted. No significant listhesis is present.
Vertebral body heights and alignment are normal. Straightening of
the normal lumbar lordosis noted.
IMPRESSION: 1. Mild leftward curvature of the lumbar spine.
2. No acute or focal abnormality.
3. Straightening of the normal lumbar lordosis. This is nonspecific,
but can be seen in the setting muscle strain or ongoing pain.

## 2021-12-13 ENCOUNTER — Other Ambulatory Visit: Payer: Self-pay

## 2021-12-13 ENCOUNTER — Emergency Department
Admission: EM | Admit: 2021-12-13 | Discharge: 2021-12-13 | Disposition: A | Discharge status: Home / Self Care | Payer: Medicaid Other | Attending: Emergency Medicine | Admitting: Emergency Medicine

## 2021-12-13 DIAGNOSIS — J020 Streptococcal pharyngitis: Secondary | ICD-10-CM | POA: Insufficient documentation

## 2021-12-13 LAB — GROUP A STREP BY PCR: Group A Strep by PCR: DETECTED — AB

## 2021-12-13 MED ORDER — AMOXICILLIN 875 MG PO TABS: 875.0000 mg | ORAL_TABLET | Freq: Two times a day (BID) | ORAL | 0 refills | Status: DC

## 2021-12-13 MED ORDER — AMOXICILLIN-POT CLAVULANATE 875-125 MG PO TABS
1.0000 | ORAL_TABLET | Freq: Once | ORAL | Status: AC
  Administered 2021-12-13: 1 via ORAL
  Filled 2021-12-13: qty 1

## 2021-12-13 NOTE — ED Notes (Signed)
Pt via POV from home. Pt c/o sore throat that started Wednesday, pt states it initially got better then came back worse. Pt states she has difficulty swallowing. Denies any other associated symptoms including fever, cough, or nasal congestion. Pt is A&OX4 and NAD

## 2021-12-13 NOTE — ED Triage Notes (Signed)
Pt states is having sore throat and swollen tonsils since Thursday. Pt is hoarse, appears in no acute distress.

## 2021-12-13 NOTE — Discharge Instructions (Signed)
Follow with your primary care provider if any continued problems or not improving.  Return to the emergency department for any severe worsening of your symptoms such as difficulty swallowing or taking the antibiotic.  Continue with Tylenol or ibuprofen as needed for throat pain, fever, headache or body aches.  Increase fluids to stay hydrated.  You are contagious for 24 hours.  The antibiotic was sent to your pharmacy.  You are to take this twice a day for the entire 10-day course.

## 2021-12-13 NOTE — ED Provider Notes (Signed)
[facility] Provider Note    Event Date/Time   First MD Initiated Contact with Patient 12/13/21 [phone]     (approximate)   History   Sore Throat   HPI  Heidi Hill is a 41 y.o. female   presents to the ED with complaint of sore throat for last 2 days.  Patient is not sure about fever or chills.  She has been taking Tylenol for throat pain.  Patient continues to eat and drink although her throat does hurt.  She denies any nausea or vomiting.  Currently she rates her pain as an 8 out of 10.      Physical Exam   Triage Vital Signs: ED Triage Vitals  Enc Vitals Group     BP 12/13/21 0649 (!) 158/95     Pulse Rate 12/13/21 0649 (!) 106     Resp 12/13/21 0649 18     Temp 12/13/21 0649 98.5 F (36.9 C)     Temp Source 12/13/21 0649 Oral     SpO2 12/13/21 0649 94 %     Weight 12/13/21 0644 210 lb (95.3 kg)     Height 12/13/21 0644 5\' 7"  (1.702 m)     Head Circumference --      Peak Flow --      Pain Score 12/13/21 0644 8     Pain Loc --      Pain Edu? --      Excl. in [employer]? --     Most recent vital signs: Vitals:   12/13/21 0649  BP: (!) 158/95  Pulse: (!) 106  Resp: 18  Temp: 98.5 F (36.9 C)  SpO2: 94%     General: Awake, no distress.  CV:  Good peripheral perfusion.  Heart regular rate and rhythm without murmur. Resp:  Normal effort.  Lungs are clear bilaterally. Abd:  No distention.  Other:  Posterior pharynx is erythematous.  Uvula is midline.  Tonsillar enlargement bilaterally.  Patient is able to maintain secretions and swallow without difficulty.  Tender bilateral cervical lymphadenopathy.   ED Results / Procedures / Treatments   Labs (all labs ordered are listed, but only abnormal results are displayed) Labs Reviewed  GROUP A STREP BY PCR - Abnormal; Notable for the following components:      Result Value   Group A Strep by PCR DETECTED (*)    All other components within normal limits      PROCEDURES:  Critical  Care performed:   Procedures   MEDICATIONS ORDERED IN ED: Medications  amoxicillin-clavulanate (AUGMENTIN) 875-125 MG per tablet 1 tablet (1 tablet Oral Given 12/13/21 0803)     IMPRESSION / MDM / ASSESSMENT AND PLAN / ED COURSE  I reviewed the triage vital signs and the nursing notes.   Differential diagnosis includes, but is not limited to, tonsillitis, strep, mono, peritonsillar abscess, peritonsillar cellulitis and viral pharyngitis.  41 year old female presents to the ED with complaint of sore throat for the last 2 days.  Patient is unaware of any known fever and denies chills.  She has been taking Tylenol for her throat pain.  On exam it is very suspicious for strep pharyngitis.  Patient continues to maintain secretions and uvula is midline.  Tender bilateral cervical lymphadenopathy.  Strep test positive and patient was made aware.  Patient was given Augmentin 875 while in the ED as she is not certain what time her pharmacy opens.  Also wanted to make sure that she can swallow the  pill without any difficulty which she did.  Patient also was made aware that she needs to take the entire 10-day course of antibiotics and that she is contagious.  A prescription for Amoxil 875 twice daily was sent to the pharmacy and she is to continue taking Tylenol or ibuprofen as needed.        FINAL CLINICAL IMPRESSION(S) / ED DIAGNOSES   Final diagnoses:  Strep pharyngitis     Rx / DC Orders   ED Discharge Orders          Ordered    amoxicillin (AMOXIL) 875 MG tablet  2 times daily        12/13/21 0744             Note:  This document was prepared using Dragon voice recognition software and may include unintentional dictation errors.   Johnn Hai, PA-C 12/13/21 7185    Merlyn Lot, MD 12/13/21 1239

## 2021-12-30 ENCOUNTER — Ambulatory Visit
Admission: EM | Admit: 2021-12-30 | Discharge: 2021-12-30 | Disposition: A | Discharge status: Home / Self Care | Payer: Self-pay | Attending: Physician Assistant | Admitting: Physician Assistant

## 2021-12-30 ENCOUNTER — Encounter: Payer: Self-pay | Admitting: Emergency Medicine

## 2021-12-30 ENCOUNTER — Other Ambulatory Visit: Payer: Self-pay

## 2021-12-30 DIAGNOSIS — R030 Elevated blood-pressure reading, without diagnosis of hypertension: Secondary | ICD-10-CM | POA: Insufficient documentation

## 2021-12-30 DIAGNOSIS — J020 Streptococcal pharyngitis: Secondary | ICD-10-CM | POA: Insufficient documentation

## 2021-12-30 LAB — GROUP A STREP BY PCR: Group A Strep by PCR: DETECTED — AB

## 2021-12-30 MED ORDER — PENICILLIN G BENZATHINE 1200000 UNIT/2ML IM SUSY
1.2000 10*6.[IU] | PREFILLED_SYRINGE | Freq: Once | INTRAMUSCULAR | Status: AC
  Administered 2021-12-30: 1.2 10*6.[IU] via INTRAMUSCULAR

## 2021-12-30 NOTE — Discharge Instructions (Addendum)
Please follow up with ENT if you continue to have recurrent strep throat. You have been treated for strep, no further medication is needed. You are contagious for 24 hours, avoid eating and drinking after others. May alternate tylenol/ibuprofen as label directed for discomfort/fever, gargle with warm salt water, may use chloraseptic throat lozenges as label directed while awake.  ?

## 2021-12-30 NOTE — ED Triage Notes (Signed)
Pt c/o sore throat, chills. Started last night. She states she had strep throat about 2 weeks ago. She took the entire coarse of antibiotics. She states she is 90% sure that she has strep.  ?

## 2021-12-30 NOTE — ED Provider Notes (Signed)
?[facility] ? ? ? ?CSN: [id number] ?Arrival date & time: 12/30/21  1635 ? ? ?  ? ?History   ?Chief Complaint ?Chief Complaint  ?Patient presents with  ? Sore Throat  ? ? ?HPI ?Heidi Hill is a 41 y.o. female.  ? ?41 year old female pt, Heidi Hill, presents to urgent care with chief complaint of sore throat that started last night, treated for strep 12/13/21.  ? ?The history is provided by the patient. No language interpreter was used.  ?Sore Throat ?This is a recurrent problem. The current episode started yesterday. The problem occurs constantly. The problem has not changed since onset.Pertinent negatives include no chest pain and no shortness of breath. The symptoms are aggravated by swallowing. Nothing relieves the symptoms. She has tried nothing for the symptoms. The treatment provided no relief.  ? ?Past Medical History:  ?Diagnosis Date  ? Asthma   ? Fibroids   ? GERD (gastroesophageal reflux disease)   ? Vaginal Pap smear, abnormal   ? ? ?Patient Active Problem List  ? Diagnosis Date Noted  ? Strep pharyngitis 12/30/2021  ? Elevated blood pressure reading 12/30/2021  ? Epiglottitis 03/21/2020  ? Tonsillitis 03/21/2020  ? Migraine with aura and without status migrainosus, not intractable 03/09/2019  ? Asthma 09/01/2018  ? Dysmenorrhea 08/26/2016  ? Tobacco user 08/26/2016  ? Cervical dysplasia 12/19/2015  ? History of depression 11/09/2013  ? Thyroid activity decreased 08/29/2013  ? Uterine leiomyoma 08/29/2013  ? ? ?Past Surgical History:  ?Procedure Laterality Date  ? COLPOSCOPY W/ BIOPSY / CURETTAGE  12/2015  ? ? ?OB History   ? ? Gravida  ?3  ? Para  ?1  ? Term  ?   ? Preterm  ?1  ? AB  ?2  ? Living  ?1  ?  ? ? SAB  ?1  ? IAB  ?   ? Ectopic  ?   ? Multiple  ?   ? Live Births  ?1  ?   ?  ?  ? ? ? ?Home Medications   ? ?Prior to Admission medications   ?Medication Sig Start Date End Date Taking? Authorizing Provider  ?omeprazole (PRILOSEC) 20 MG capsule Take 20 mg by mouth.    Yes [provider]  ?albuterol (VENTOLIN HFA) 108 (90 Base) MCG/ACT inhaler Inhale 1 puff into the lungs every 6 (six) hours as needed for wheezing or shortness of breath. Pt reports taking as needed.    [provider]  ?amoxicillin (AMOXIL) 875 MG tablet Take 1 tablet (875 mg total) by mouth 2 (two) times daily. 12/13/21   Johnn Hai, PA-C  ?norethindrone-ethinyl estradiol (MICROGESTIN) 1-20 MG-MCG tablet Take 1 tablet by mouth daily. 08/30/20   Jerene Dilling, PA  ? ? ?Family History ?Family History  ?Problem Relation Age of Onset  ? Diabetes Mother   ? ? ?Social History ?Social History  ? ?Tobacco Use  ? Smoking status: Every Day  ?  Packs/day: 0.25  ?  Types: Cigarettes  ? Smokeless tobacco: Never  ?Vaping Use  ? Vaping Use: Never used  ?Substance Use Topics  ? Alcohol use: Not Currently  ?  Alcohol/week: 2.0 standard drinks  ?  Types: 2 Glasses of wine per week  ?  Comment: "i just drink on the weekends"  ? Drug use: No  ? ? ? ?Allergies   ?Patient has no known allergies. ? ? ?Review of Systems ?Review of Systems  ?Constitutional:  Negative for  fever.  ?HENT:  Positive for sore throat and voice change.   ?Respiratory:  Negative for shortness of breath.   ?Cardiovascular:  Negative for chest pain.  ?Musculoskeletal:  Negative for neck pain and neck stiffness.  ?Skin:  Negative for rash.  ?All other systems reviewed and are negative. ? ? ?Physical Exam ?Triage Vital Signs ?ED Triage Vitals  ?Enc Vitals Group  ?   BP 12/30/21 1725 (!) 141/89  ?   Pulse Rate 12/30/21 1725 96  ?   Resp 12/30/21 1725 18  ?   Temp 12/30/21 1725 98.4 ?F (36.9 ?C)  ?   Temp Source 12/30/21 1725 Oral  ?   SpO2 12/30/21 1725 98 %  ?   Weight 12/30/21 1723 210 lb 1.6 oz (95.3 kg)  ?   Height 12/30/21 1723 '5\' 7"'$  (1.702 m)  ?   Head Circumference --   ?   Peak Flow --   ?   Pain Score 12/30/21 1722 9  ?   Pain Loc --   ?   Pain Edu? --   ?   Excl. in [place]? --   ? ?No data found. ? ?Updated Vital Signs ?BP (!) 141/89 (BP Location:  Left Arm)   Pulse 96   Temp 98.4 ?F (36.9 ?C) (Oral)   Resp 18   Ht '5\' 7"'$  (1.702 m)   Wt 210 lb 1.6 oz (95.3 kg)   LMP 12/16/2021 (Approximate)   SpO2 98%   BMI 32.91 kg/m?  ? ?Visual Acuity ?Right Eye Distance:   ?Left Eye Distance:   ?Bilateral Distance:   ? ?Right Eye Near:   ?Left Eye Near:    ?Bilateral Near:    ? ?Physical Exam ?Vitals and nursing note reviewed.  ?Constitutional:   ?   Appearance: Normal appearance. She is well-developed and well-groomed.  ?HENT:  ?   Head: Normocephalic.  ?   Right Ear: Tympanic membrane normal.  ?   Left Ear: Tympanic membrane normal.  ?   Nose: Nose normal.  ?   Mouth/Throat:  ?   Lips: Pink.  ?   Mouth: Mucous membranes are moist.  ?   Pharynx: Oropharynx is clear. No oropharyngeal exudate or uvula swelling.  ?   Tonsils: No tonsillar exudate or tonsillar abscesses.  ?Eyes:  ?   General: Lids are normal.  ?   Extraocular Movements: Extraocular movements intact.  ?   Pupils: Pupils are equal, round, and reactive to light.  ?Neck:  ?   Trachea: Trachea normal.  ?Cardiovascular:  ?   Rate and Rhythm: Normal rate and regular rhythm.  ?   Pulses: Normal pulses.  ?   Heart sounds: Normal heart sounds.  ?Pulmonary:  ?   Effort: Pulmonary effort is normal.  ?   Breath sounds: Normal breath sounds and air entry.  ?Musculoskeletal:  ?   Cervical back: Full passive range of motion without pain and normal range of motion.  ?Neurological:  ?   General: No focal deficit present.  ?   Mental Status: She is alert and oriented to person, place, and time.  ?   GCS: GCS eye subscore is 4. GCS verbal subscore is 5. GCS motor subscore is 6.  ?Psychiatric:     ?   Attention and Perception: Attention normal.     ?   Mood and Affect: Mood normal.     ?   Speech: Speech normal.     ?   Behavior: Behavior  normal. Behavior is cooperative.  ? ? ? ?UC Treatments / Results  ?Labs ?(all labs ordered are listed, but only abnormal results are displayed) ?Labs Reviewed  ?GROUP A STREP BY PCR -  Abnormal; Notable for the following components:  ?    Result Value  ? Group A Strep by PCR DETECTED (*)   ? All other components within normal limits  ? ? ?EKG ? ? ?Radiology ?No results found. ? ?Procedures ?Procedures (including critical care time) ? ?Medications Ordered in UC ?Medications  ?penicillin g benzathine (BICILLIN LA) 1200000 UNIT/2ML injection 1.2 Million Units (1.2 Million Units Intramuscular Given 12/30/21 1816)  ? ? ?Initial Impression / Assessment and Plan / UC Course  ?I have reviewed the triage vital signs and the nursing notes. ? ?Pertinent labs & imaging results that were available during my care of the patient were reviewed by me and considered in my medical decision making (see chart for details). ? ?  ? ?Ddx: Strep pharyngitis, tonsillitis, viral illness ?Final Clinical Impressions(s) / UC Diagnoses  ? ?Final diagnoses:  ?Strep pharyngitis  ?Elevated blood pressure reading  ? ? ? ?Discharge Instructions   ? ?  ?Please follow up with ENT if you continue to have recurrent strep throat. You have been treated for strep, no further medication is needed. You are contagious for 24 hours, avoid eating and drinking after others. May alternate tylenol/ibuprofen as label directed for discomfort/fever, gargle with warm salt water, may use chloraseptic throat lozenges as label directed while awake.  ? ? ? ? ?ED Prescriptions   ?None ?  ? ?PDMP not reviewed this encounter. ?  ?Tori Milks, NP ?96/78/93 1849 ? ?

## 2022-04-14 ENCOUNTER — Ambulatory Visit
Admission: EM | Admit: 2022-04-14 | Discharge: 2022-04-14 | Disposition: A | Discharge status: Home / Self Care | Payer: BC Managed Care – PPO | Attending: Emergency Medicine | Admitting: Emergency Medicine

## 2022-04-14 DIAGNOSIS — J020 Streptococcal pharyngitis: Secondary | ICD-10-CM | POA: Insufficient documentation

## 2022-04-14 LAB — GROUP A STREP BY PCR: Group A Strep by PCR: DETECTED — AB

## 2022-04-14 MED ORDER — PENICILLIN G BENZATHINE 1200000 UNIT/2ML IM SUSY
1.2000 10*6.[IU] | PREFILLED_SYRINGE | Freq: Once | INTRAMUSCULAR | Status: AC
  Administered 2022-04-14: 1.2 10*6.[IU] via INTRAMUSCULAR

## 2022-04-14 NOTE — ED Provider Notes (Signed)
[facility]    CSN: [id number] Arrival date & time: 04/14/22  1344      History   Chief Complaint Chief Complaint  Patient presents with   Fever   Sore Throat    HPI Heidi Hill is a 41 y.o. female.   HPI  41 year old female here for evaluation of sore throat and fever.  Patient reports that she developed a sore throat and fever yesterday and reports her fever achieved a Tmax of 102.  She denies any runny nose, nasal congestion, ear pain, or cough.  Also denies any sick contacts.  She was diagnosed with strep on 12/13/2021 in the emergency department at [facility] where she was treated with amoxicillin 875 twice daily.  Patient reports that she again had strep in March.  She was seen here on 12/30/2021 and was treated with a shot of IM penicillin.  She reports that she would prefer to have the IM shot if she has strep again today  Past Medical History:  Diagnosis Date   Asthma    Fibroids    GERD (gastroesophageal reflux disease)    Vaginal Pap smear, abnormal     Patient Active Problem List   Diagnosis Date Noted   Strep pharyngitis 12/30/2021   Elevated blood pressure reading 12/30/2021   Epiglottitis 03/21/2020   Tonsillitis 03/21/2020   Migraine with aura and without status migrainosus, not intractable 03/09/2019   Asthma 09/01/2018   Dysmenorrhea 08/26/2016   Tobacco user 08/26/2016   Cervical dysplasia 12/19/2015   History of depression 11/09/2013   Thyroid activity decreased 08/29/2013   Uterine leiomyoma 08/29/2013    Past Surgical History:  Procedure Laterality Date   COLPOSCOPY W/ BIOPSY / CURETTAGE  12/2015    OB History     Gravida  3   Para  1   Term      Preterm  1   AB  2   Living  1      SAB  1   IAB      Ectopic      Multiple      Live Births  1            Home Medications    Prior to Admission medications   Medication Sig Start Date End Date Taking? Authorizing Provider  albuterol (VENTOLIN HFA) 108  (90 Base) MCG/ACT inhaler Inhale 1 puff into the lungs every 6 (six) hours as needed for wheezing or shortness of breath. Pt reports taking as needed.   Yes [provider]  norethindrone-ethinyl estradiol (MICROGESTIN) 1-20 MG-MCG tablet Take 1 tablet by mouth daily. 08/30/20  Yes Hampton, Marylynn Pearson, PA  omeprazole (PRILOSEC) 20 MG capsule Take 20 mg by mouth.    Yes [provider]    Family History Family History  Problem Relation Age of Onset   Diabetes Mother     Social History Social History   Tobacco Use   Smoking status: Every Day    Packs/day: 0.25    Types: Cigarettes   Smokeless tobacco: Never  Vaping Use   Vaping Use: Never used  Substance Use Topics   Alcohol use: Not Currently    Alcohol/week: 2.0 standard drinks of alcohol    Types: 2 Glasses of wine per week    Comment: "i just drink on the weekends"   Drug use: No     Allergies   Patient has no known allergies.   Review of Systems Review of Systems  Constitutional:  Positive for fever.  HENT:  Positive for sore throat. Negative for congestion, ear pain and rhinorrhea.   Respiratory:  Negative for cough.   Hematological: Negative.   Psychiatric/Behavioral: Negative.       Physical Exam Triage Vital Signs ED Triage Vitals  Enc Vitals Group     BP 04/14/22 1354 123/75     Pulse Rate 04/14/22 1354 93     Resp 04/14/22 1354 18     Temp 04/14/22 1354 98.2 F (36.8 C)     Temp Source 04/14/22 1354 Oral     SpO2 04/14/22 1354 97 %     Weight 04/14/22 1352 200 lb (90.7 kg)     Height 04/14/22 1352 5\' 7"  (1.702 m)     Head Circumference --      Peak Flow --      Pain Score 04/14/22 1351 3     Pain Loc --      Pain Edu? --      Excl. in [place]? --    No data found.  Updated Vital Signs BP 123/75 (BP Location: Left Arm)   Pulse 93   Temp 98.2 F (36.8 C) (Oral)   Resp 18   Ht 5\' 7"  (1.702 m)   Wt 200 lb (90.7 kg)   LMP 03/31/2022 (Approximate)   SpO2 97%   BMI 31.32 kg/m    Visual Acuity Right Eye Distance:   Left Eye Distance:   Bilateral Distance:    Right Eye Near:   Left Eye Near:    Bilateral Near:     Physical Exam Vitals and nursing note reviewed.  Constitutional:      Appearance: Normal appearance. She is ill-appearing.  HENT:     Head: Normocephalic and atraumatic.     Right Ear: Tympanic membrane, ear canal and external ear normal. There is no impacted cerumen.     Left Ear: Tympanic membrane, ear canal and external ear normal. There is no impacted cerumen.     Nose: Nose normal.     Mouth/Throat:     Mouth: Mucous membranes are moist.     Pharynx: Oropharyngeal exudate and posterior oropharyngeal erythema present.  Cardiovascular:     Rate and Rhythm: Normal rate and regular rhythm.     Pulses: Normal pulses.     Heart sounds: Normal heart sounds. No murmur heard.    No friction rub. No gallop.  Pulmonary:     Effort: Pulmonary effort is normal.     Breath sounds: Normal breath sounds. No wheezing, rhonchi or rales.  Musculoskeletal:     Cervical back: Normal range of motion and neck supple.  Lymphadenopathy:     Cervical: Cervical adenopathy present.  Skin:    General: Skin is warm and dry.     Capillary Refill: Capillary refill takes less than 2 seconds.     Findings: No erythema or rash.  Neurological:     General: No focal deficit present.     Mental Status: She is alert and oriented to person, place, and time.  Psychiatric:        Mood and Affect: Mood normal.        Behavior: Behavior normal.        Thought Content: Thought content normal.        Judgment: Judgment normal.      UC Treatments / Results  Labs (all labs ordered are listed, but only abnormal results are displayed) Labs Reviewed  GROUP A STREP BY PCR -  Abnormal; Notable for the following components:      Result Value   Group A Strep by PCR DETECTED (*)    All other components within normal limits    EKG   Radiology No results  found.  Procedures Procedures (including critical care time)  Medications Ordered in UC Medications  penicillin g benzathine (BICILLIN LA) 1200000 UNIT/2ML injection 1.2 Million Units (1.2 Million Units Intramuscular Given 04/14/22 1442)    Initial Impression / Assessment and Plan / UC Course  I have reviewed the triage vital signs and the nursing notes.  Pertinent labs & imaging results that were available during my care of the patient were reviewed by me and considered in my medical decision making (see chart for details).  Patient is a pleasant though ill-appearing 42 year old female here for evaluation of sore throat and fever that began yesterday as outlined in HPI above.  She denies any other upper or lower respiratory symptoms.  Her physical exam reveals pearly-gray tympanic membranes bilaterally with normal light reflex and clear external auditory canals.  Oropharyngeal exam reveals 2+ edematous and erythematous tonsillar pillars with white exudate.  She also has bilateral anterior cervical lymphadenopathy on exam.  Cardiopulmonary exam reveals S1-S2 heart sounds with regular rate and rhythm and lung sounds that are clear to auscultation all fields.  Will check strep PCR.  Strep PCR is positive.  Patient requesting Bicillin LA injection versus oral antibiotic therapy.  I will order 1,200,000 units of Bicillin LA IM and discharge her home.  Work note provided.   Final Clinical Impressions(s) / UC Diagnoses   Final diagnoses:  Strep pharyngitis     Discharge Instructions      Gargle with warm salt water 2-3 times a day to soothe your throat, aid in pain relief, and aid in healing.  Take over-the-counter ibuprofen according to the package instructions as needed for pain.  You can also use Chloraseptic or Sucrets lozenges, 1 lozenge every 2 hours as needed for throat pain.  If you develop any new or worsening symptoms return for reevaluation.      ED Prescriptions    None    PDMP not reviewed this encounter.   Becky Augusta, NP 04/14/22 [phone]

## 2022-04-15 ENCOUNTER — Other Ambulatory Visit: Payer: Self-pay

## 2022-04-15 ENCOUNTER — Emergency Department
Admission: EM | Admit: 2022-04-15 | Discharge: 2022-04-15 | Disposition: A | Discharge status: Home / Self Care | Payer: BC Managed Care – PPO | Attending: Emergency Medicine | Admitting: Emergency Medicine

## 2022-04-15 ENCOUNTER — Emergency Department: Payer: BC Managed Care – PPO

## 2022-04-15 DIAGNOSIS — J0300 Acute streptococcal tonsillitis, unspecified: Secondary | ICD-10-CM | POA: Insufficient documentation

## 2022-04-15 DIAGNOSIS — J45909 Unspecified asthma, uncomplicated: Secondary | ICD-10-CM | POA: Insufficient documentation

## 2022-04-15 DIAGNOSIS — D72829 Elevated white blood cell count, unspecified: Secondary | ICD-10-CM | POA: Insufficient documentation

## 2022-04-15 DIAGNOSIS — J020 Streptococcal pharyngitis: Secondary | ICD-10-CM

## 2022-04-15 DIAGNOSIS — J029 Acute pharyngitis, unspecified: Secondary | ICD-10-CM | POA: Diagnosis present

## 2022-04-15 DIAGNOSIS — R000 Tachycardia, unspecified: Secondary | ICD-10-CM | POA: Diagnosis not present

## 2022-04-15 DIAGNOSIS — J039 Acute tonsillitis, unspecified: Secondary | ICD-10-CM

## 2022-04-15 LAB — CBC WITH DIFFERENTIAL/PLATELET
Abs Immature Granulocytes: 0.07 10*3/uL (ref 0.00–0.07)
Basophils Absolute: 0.1 10*3/uL (ref 0.0–0.1)
Basophils Relative: 0 %
Eosinophils Absolute: 0.1 10*3/uL (ref 0.0–0.5)
Eosinophils Relative: 1 %
HCT: 44.3 % (ref 36.0–46.0)
Hemoglobin: 14.3 g/dL (ref 12.0–15.0)
Immature Granulocytes: 1 %
Lymphocytes Relative: 7 %
Lymphs Abs: 1.1 10*3/uL (ref 0.7–4.0)
MCH: 30.6 pg (ref 26.0–34.0)
MCHC: 32.3 g/dL (ref 30.0–36.0)
MCV: 94.7 fL (ref 80.0–100.0)
Monocytes Absolute: 0.8 10*3/uL (ref 0.1–1.0)
Monocytes Relative: 5 %
Neutro Abs: 13.3 10*3/uL — ABNORMAL HIGH (ref 1.7–7.7)
Neutrophils Relative %: 86 %
Platelets: 284 10*3/uL (ref 150–400)
RBC: 4.68 MIL/uL (ref 3.87–5.11)
RDW: 13.8 % (ref 11.5–15.5)
WBC: 15.5 10*3/uL — ABNORMAL HIGH (ref 4.0–10.5)
nRBC: 0 % (ref 0.0–0.2)

## 2022-04-15 LAB — BASIC METABOLIC PANEL
Anion gap: 9 (ref 5–15)
BUN: 10 mg/dL (ref 6–20)
CO2: 25 mmol/L (ref 22–32)
Calcium: 9.3 mg/dL (ref 8.9–10.3)
Chloride: 104 mmol/L (ref 98–111)
Creatinine, Ser: 0.91 mg/dL (ref 0.44–1.00)
GFR, Estimated: >60 mL/min (ref >60)
Glucose, Bld: 98 mg/dL (ref 70–99)
Potassium: 3.6 mmol/L (ref 3.5–5.1)
Sodium: 138 mmol/L (ref 135–145)

## 2022-04-15 MED ORDER — PENICILLIN V POTASSIUM 500 MG PO TABS
500.0000 mg | ORAL_TABLET | Freq: Three times a day (TID) | ORAL | 0 refills | Status: AC
Start: 2022-04-15 — End: 2022-04-25

## 2022-04-15 MED ORDER — DEXAMETHASONE SODIUM PHOSPHATE 10 MG/ML IJ SOLN
10.0000 mg | Freq: Once | INTRAMUSCULAR | Status: AC
  Administered 2022-04-15: 10 mg via INTRAVENOUS
  Filled 2022-04-15: qty 1

## 2022-04-15 MED ORDER — SODIUM CHLORIDE 0.9 % IV BOLUS
1000.0000 mL | Freq: Once | INTRAVENOUS | Status: AC
  Administered 2022-04-15: 1000 mL via INTRAVENOUS

## 2022-04-15 MED ORDER — KETOROLAC TROMETHAMINE 15 MG/ML IJ SOLN
15.0000 mg | Freq: Once | INTRAMUSCULAR | Status: AC
  Administered 2022-04-15: 15 mg via INTRAVENOUS
  Filled 2022-04-15: qty 1

## 2022-04-15 MED ORDER — IOHEXOL 300 MG/ML  SOLN
100.0000 mL | Freq: Once | INTRAMUSCULAR | Status: AC | PRN
  Administered 2022-04-15: 100 mL via INTRAVENOUS

## 2022-04-15 NOTE — ED Triage Notes (Signed)
Pt c/o sore throat since yesterday and was seen at [facility] urgent care yesterday and dx with strep and given PCN injection, states today she has  fever and feels like the swelling is worse.

## 2022-04-15 NOTE — ED Provider Notes (Signed)
[facility] Provider Note    Event Date/Time   First MD Initiated Contact with Patient 04/15/22 1021     (approximate)   History   Sore Throat   HPI  Heidi Hill is a 41 y.o. female with a past medical history of depression, pharyngitis, epiglottitis, tonsillitis who presents today for evaluation of worsening sore throat.  Patient reports that she was diagnosed with strep throat yesterday.  Her symptoms began yesterday with sore throat and fever.  She reports that she had a Tmax of 102 F.  She was treated with a penicillin injection.  Patient reports that she feels that everything is worse today.  She reports that she is having trouble swallowing her own secretions because of the pain and she has been spitting them out.  She reports that she has a voice change as well, reports that she has a fever that she "cannot get down."  She reports that she has been taking ibuprofen.  She denies trouble breathing.  Patient Active Problem List   Diagnosis Date Noted   Strep pharyngitis 12/30/2021   Elevated blood pressure reading 12/30/2021   Epiglottitis 03/21/2020   Tonsillitis 03/21/2020   Migraine with aura and without status migrainosus, not intractable 03/09/2019   Asthma 09/01/2018   Dysmenorrhea 08/26/2016   Tobacco user 08/26/2016   Cervical dysplasia 12/19/2015   History of depression 11/09/2013   Thyroid activity decreased 08/29/2013   Uterine leiomyoma 08/29/2013          Physical Exam   Triage Vital Signs: ED Triage Vitals  Enc Vitals Group     BP 04/15/22 1026 (!) 144/99     Pulse Rate 04/15/22 1026 (!) 110     Resp 04/15/22 1026 16     Temp 04/15/22 1026 99.1 F (37.3 C)     Temp Source 04/15/22 1026 Oral     SpO2 04/15/22 1026 93 %     Weight 04/15/22 1024 199 lb 15.3 oz (90.7 kg)     Height 04/15/22 1024 '5\' 7"'$  (1.702 m)     Head Circumference --      Peak Flow --      Pain Score --      Pain Loc --      Pain Edu? --       Excl. in [place]? --     Most recent vital signs: Vitals:   04/15/22 1026 04/15/22 1256  BP: (!) 144/99 140/88  Pulse: (!) 110 99  Resp: 16 16  Temp: 99.1 F (37.3 C)   SpO2: 93% 95%    Physical Exam Vitals and nursing note reviewed.  Constitutional:      General: Awake and alert. No acute distress.    Appearance: Normal appearance. The patient is normal weight.  HENT:     Head: Normocephalic and atraumatic.     Mouth: Mucous membranes are moist.  Uvula midline.  No tonsillar exudate.  3+ tonsils bilaterally without exudate.  No soft palate fluctuance.  No trismus.  Mild muffled voice present.  No sublingual swelling.  No nuchal rigidity.  No drooling. Eyes:     General: PERRL. Normal EOMs        Right eye: No discharge.        Left eye: No discharge.     Conjunctiva/sclera: Conjunctivae normal.  Cardiovascular:     Rate and Rhythm: Normal rate and regular rhythm.     Pulses: Normal pulses.     Heart sounds: Normal  heart sounds Pulmonary:     Effort: Pulmonary effort is normal. No respiratory distress.  Able to lay flat on the stretcher without respiratory distress.  No tripoding    Breath sounds: Normal breath sounds.  Abdominal:     Abdomen is soft. There is no abdominal tenderness. No rebound or guarding. No distention. Musculoskeletal:        General: No swelling. Normal range of motion.     Cervical back: Normal range of motion and neck supple.  Skin:    General: Skin is warm and dry.     Capillary Refill: Capillary refill takes less than 2 seconds.     Findings: No rash.  Neurological:     Mental Status: The patient is awake and alert.      ED Results / Procedures / Treatments   Labs (all labs ordered are listed, but only abnormal results are displayed) Labs Reviewed  CBC WITH DIFFERENTIAL/PLATELET - Abnormal; Notable for the following components:      Result Value   WBC 15.5 (*)    Neutro Abs 13.3 (*)    All other components within normal limits  BASIC  METABOLIC PANEL     EKG     RADIOLOGY I independently reviewed and interpreted imaging and agree with radiologists findings.     PROCEDURES:  Critical Care performed:   Procedures   MEDICATIONS ORDERED IN ED: Medications  dexamethasone (DECADRON) injection 10 mg (10 mg Intravenous Given 04/15/22 1106)  ketorolac (TORADOL) 15 MG/ML injection 15 mg (15 mg Intravenous Given 04/15/22 1107)  sodium chloride 0.9 % bolus 1,000 mL (1,000 mLs Intravenous New Bag/Given 04/15/22 1107)  iohexol (OMNIPAQUE) 300 MG/ML solution 100 mL (100 mLs Intravenous Contrast Given 04/15/22 1212)     IMPRESSION / MDM / ASSESSMENT AND PLAN / ED COURSE  I reviewed the triage vital signs and the nursing notes.   Differential diagnosis includes, but is not limited to, strep pharyngitis, retropharyngeal abscess, peritonsillar abscess, epiglottitis.  Patient is tachycardic on arrival though appears to be uncomfortable.  She is afebrile.  She demonstrates no increased work of breathing.  No tripoding.  She is able to lay flat on the stretcher without any trouble breathing.  She has enlarged tonsils bilaterally without uvular deviation.  She does have a muffled voice, though her tonsils are quite large.  She was treated symptomatically with dexamethasone and Toradol with significant improvement of her pain and resolution of her muffled voice.  Labs reveal a leukocytosis to 15, which is expected in the setting of strep pharyngitis.  CT scan demonstrates swelling of her palatine tonsils without focal abscess, no evidence of epiglottitis.  Patient was started on the full course of antibiotics for treatment of strep throat.  We discussed return precautions and the importance of close outpatient follow-up.  Patient understands and agrees with plan.  She was discharged in stable condition.     Patient's presentation is most consistent with acute complicated illness / injury requiring diagnostic workup.   Clinical Course  as of 04/15/22 1257  Wed Apr 15, 2022  1248 Patient reports significantly improved [JP]    Clinical Course User Index [JP] Elandra Powell, Clarnce Flock, PA-C     FINAL CLINICAL IMPRESSION(S) / ED DIAGNOSES   Final diagnoses:  Strep throat  Tonsillitis     Rx / DC Orders   ED Discharge Orders          Ordered    penicillin v potassium (VEETID) 500 MG tablet  3  times daily        04/15/22 1251             Note:  This document was prepared using Dragon voice recognition software and may include unintentional dictation errors.   Emeline Gins 04/15/22 1307    Vladimir Crofts, MD 04/15/22 [phone]

## 2022-04-15 NOTE — Discharge Instructions (Signed)
Your CT scan did not show any abscess.  Take the antibiotics as prescribed for the full 10 days.  Please return for any new, worsening, or change in symptoms or other concerns.

## 2022-08-10 ENCOUNTER — Encounter: Payer: Self-pay | Admitting: Emergency Medicine

## 2022-08-10 ENCOUNTER — Other Ambulatory Visit: Payer: Self-pay

## 2022-08-10 ENCOUNTER — Emergency Department
Admission: EM | Admit: 2022-08-10 | Discharge: 2022-08-10 | Disposition: A | Discharge status: Home / Self Care | Payer: BC Managed Care – PPO | Attending: Emergency Medicine | Admitting: Emergency Medicine

## 2022-08-10 DIAGNOSIS — J020 Streptococcal pharyngitis: Secondary | ICD-10-CM | POA: Diagnosis not present

## 2022-08-10 DIAGNOSIS — J029 Acute pharyngitis, unspecified: Secondary | ICD-10-CM

## 2022-08-10 DIAGNOSIS — J359 Chronic disease of tonsils and adenoids, unspecified: Secondary | ICD-10-CM | POA: Insufficient documentation

## 2022-08-10 LAB — GROUP A STREP BY PCR: Group A Strep by PCR: DETECTED — AB

## 2022-08-10 MED ORDER — AMOXICILLIN 500 MG PO TABS: 500.0000 mg | ORAL_TABLET | Freq: Two times a day (BID) | ORAL | 0 refills | Status: AC

## 2022-08-10 MED ORDER — DEXAMETHASONE SODIUM PHOSPHATE 10 MG/ML IJ SOLN
8.0000 mg | Freq: Once | INTRAMUSCULAR | Status: AC
  Administered 2022-08-10: 8 mg via INTRAMUSCULAR
  Filled 2022-08-10: qty 1

## 2022-08-10 MED ORDER — LIDOCAINE VISCOUS HCL 2 % MT SOLN
15.0000 mL | Freq: Once | OROMUCOSAL | Status: AC
  Administered 2022-08-10: 15 mL via OROMUCOSAL
  Filled 2022-08-10: qty 15

## 2022-08-10 MED ORDER — KETOROLAC TROMETHAMINE 30 MG/ML IJ SOLN
30.0000 mg | Freq: Once | INTRAMUSCULAR | Status: AC
  Administered 2022-08-10: 30 mg via INTRAMUSCULAR
  Filled 2022-08-10: qty 1

## 2022-08-10 MED ORDER — AMOXICILLIN 500 MG PO CAPS
500.0000 mg | ORAL_CAPSULE | Freq: Once | ORAL | Status: AC
  Administered 2022-08-10: 500 mg via ORAL
  Filled 2022-08-10: qty 1

## 2022-08-10 NOTE — ED Triage Notes (Signed)
Pt arrived via POV with reports of recurrent strep throat, pt c/o difficulty swallowing pt states every time she gets it gets worse.  Pt has not been to ENT due to losing job and no insurance.

## 2022-08-10 NOTE — ED Provider Notes (Signed)
[facility] Provider Note    Event Date/Time   First MD Initiated Contact with Patient 08/10/22 0543     (approximate)   History   Sore Throat   HPI  Heidi Hill is a 41 y.o. female who presents to the ED for evaluation of Sore Throat   I reviewed 4 ED and urgent care visits where she was diagnosed with strep pharyngitis in the past year.  Patient presents to the ED for the evaluation of sore throat, odynophagia and hoarse voice for the past 12 hours or so, reminiscent of previous episodes of strep throat.  No recent antibiotics or steroids.  She reports frustration with recurrently getting this infection, but unable to see ENT due to financial reasons.   Physical Exam   Triage Vital Signs: ED Triage Vitals  Enc Vitals Group     BP --      Pulse --      Resp --      Temp --      Temp src --      SpO2 --      Weight 08/10/22 0539 200 lb (90.7 kg)     Height 08/10/22 0539 '5\' 7"'$  (1.702 m)     Head Circumference --      Peak Flow --      Pain Score 08/10/22 0540 8     Pain Loc --      Pain Edu? --      Excl. in [place]? --     Most recent vital signs: Vitals:   08/10/22 0544  BP: (!) 149/87  Pulse: 98  Resp: 18  Temp: 97.9 F (36.6 C)  SpO2: 98%    General: Awake, no distress.  Ambulatory with normal gait.  Looks well systemically.  Very slight hoarse voice is noted, she is conversational in full sentences.  No signs of upper airway obstruction.  Able to open her mouth widely, uvula is midline, posterior pharynx is erythematous,.  Purulent left tonsil is noted.  2+ bilaterally. CV:  Good peripheral perfusion.  Resp:  Normal effort.  Abd:  No distention.  MSK:  No deformity noted.  Neuro:  No focal deficits appreciated. Other:     ED Results / Procedures / Treatments   Labs (all labs ordered are listed, but only abnormal results are displayed) Labs Reviewed  GROUP A STREP BY PCR - Abnormal; Notable for the following components:       Result Value   Group A Strep by PCR DETECTED (*)    All other components within normal limits    EKG   RADIOLOGY   Official radiology report(s): No results found.  PROCEDURES and INTERVENTIONS:  Procedures  Medications  amoxicillin (AMOXIL) capsule 500 mg (has no administration in time range)  lidocaine (XYLOCAINE) 2 % viscous mouth solution 15 mL (15 mLs Mouth/Throat Given 08/10/22 0604)  dexamethasone (DECADRON) injection 8 mg (8 mg Intramuscular Given 08/10/22 0606)  ketorolac (TORADOL) 30 MG/ML injection 30 mg (30 mg Intramuscular Given 08/10/22 0605)     IMPRESSION / MDM / ASSESSMENT AND PLAN / ED COURSE  I reviewed the triage vital signs and the nursing notes.  Differential diagnosis includes, but is not limited to, strep throat, viral syndrome, PTA  {Patient presents with symptoms of an acute illness or injury that is potentially life-threatening.  40 year old woman with history of recurrent strep throat presents with symptoms of another recurrence.  She looks well.  I see no indications  for serum diagnostics or CT imaging of the soft tissue neck.  Doubt peritonsillar abscess.  We will provide steroids, anti-inflammatories and check her for strep throat.  Clinical Course as of 08/10/22 0645  Mon Aug 10, 2022  0644 Updated patient of strep throat result.  Reports feeling better after medications.  We discussed starting oxacillin.  Discussed return precautions. [DS]    Clinical Course User Index [DS] Vladimir Crofts, MD     FINAL CLINICAL IMPRESSION(S) / ED DIAGNOSES   Final diagnoses:  Pharyngitis, unspecified etiology  Strep throat     Rx / DC Orders   ED Discharge Orders          Ordered    amoxicillin (AMOXIL) 500 MG tablet  2 times daily        08/10/22 8502             Note:  This document was prepared using Dragon voice recognition software and may include unintentional dictation errors.   Vladimir Crofts, MD 08/10/22 [phone]

## 2022-08-10 NOTE — Discharge Instructions (Addendum)
Please take Tylenol and ibuprofen/Advil for your pain.  It is safe to take them together, or to alternate them every few hours.  Take up to 1000mg of Tylenol at a time, up to 4 times per day.  Do not take more than 4000 mg of Tylenol in 24 hours.  For ibuprofen, take 400-600 mg, 3 - 4 times per day.  

## 2022-11-10 ENCOUNTER — Encounter: Payer: Self-pay | Admitting: Emergency Medicine

## 2022-11-10 ENCOUNTER — Ambulatory Visit
Admission: EM | Admit: 2022-11-10 | Discharge: 2022-11-10 | Disposition: A | Discharge status: Home / Self Care | Payer: No Typology Code available for payment source | Attending: Emergency Medicine | Admitting: Emergency Medicine

## 2022-11-10 DIAGNOSIS — Z1152 Encounter for screening for COVID-19: Secondary | ICD-10-CM | POA: Insufficient documentation

## 2022-11-10 DIAGNOSIS — J101 Influenza due to other identified influenza virus with other respiratory manifestations: Secondary | ICD-10-CM

## 2022-11-10 LAB — RESP PANEL BY RT-PCR (RSV, FLU A&B, COVID)  RVPGX2
Influenza A by PCR: NEGATIVE
Influenza B by PCR: POSITIVE — AB
Resp Syncytial Virus by PCR: NEGATIVE
SARS Coronavirus 2 by RT PCR: NEGATIVE

## 2022-11-10 MED ORDER — ALBUTEROL SULFATE HFA 108 (90 BASE) MCG/ACT IN AERS: 2.0000 | INHALATION_SPRAY | RESPIRATORY_TRACT | 0 refills | Status: AC | PRN

## 2022-11-10 MED ORDER — PROMETHAZINE-DM 6.25-15 MG/5ML PO SYRP: 5.0000 mL | ORAL_SOLUTION | Freq: Four times a day (QID) | ORAL | 0 refills | Status: DC | PRN

## 2022-11-10 MED ORDER — IPRATROPIUM BROMIDE 0.06 % NA SOLN
2.0000 | Freq: Four times a day (QID) | NASAL | 12 refills | Status: DC
Start: 2022-11-10 — End: 2023-10-03

## 2022-11-10 MED ORDER — BENZONATATE 100 MG PO CAPS: 200.0000 mg | ORAL_CAPSULE | Freq: Three times a day (TID) | ORAL | 0 refills | Status: DC

## 2022-11-10 MED ORDER — OSELTAMIVIR PHOSPHATE 75 MG PO CAPS: 75.0000 mg | ORAL_CAPSULE | Freq: Two times a day (BID) | ORAL | 0 refills | Status: DC

## 2022-11-10 NOTE — Discharge Instructions (Addendum)
Take the Tamiflu twice daily for 5 days for treatment of influenza.  Use the Atrovent nasal spray, 2 squirts up each nostril every 6 hours, as needed for nasal congestion and runny nose.  The albuterol inhaler, 2 puffs every 4-6 hours, as needed for shortness of breath and wheezing.  Use the Tessalon Perles every 8 hours as needed for cough.  Taken with a small sip of water.  You may experience some numbness to your tongue or metallic taste in her mouth, this is normal.  Use the Promethazine DM cough syrup at bedtime as will make you drowsy but it should help dry up your postnasal drip and aid you in sleep and cough relief.  Return for reevaluation, or see your primary care provider, for new or worsening symptoms.

## 2022-11-10 NOTE — ED Provider Notes (Signed)
[facility]    CSN: [id number] Arrival date & time: 11/10/22  0949      History   Chief Complaint Chief Complaint  Patient presents with   Cough   Eye Problem   Fatigue    HPI Collen Hostler is a 42 y.o. female.   HPI  43 year old female here for evaluation of respiratory complaints.  The patient reports that she began with itchy watery eyes 2 days ago.  She denies any redness or crusting to her lashes.  Yesterday she developed nasal congestion with runny nose for clear mucus, nonproductive cough, shortness of breath, and wheezing.  She has not had a fever and she denies ear pain or sore throat.  No GI symptoms or known sick contacts.  She states that her cough is keeping her from sleeping at nights as well.  Past Medical History:  Diagnosis Date   Asthma    Fibroids    GERD (gastroesophageal reflux disease)    Vaginal Pap smear, abnormal     Patient Active Problem List   Diagnosis Date Noted   Strep pharyngitis 12/30/2021   Elevated blood pressure reading 12/30/2021   Epiglottitis 03/21/2020   Tonsillitis 03/21/2020   Migraine with aura and without status migrainosus, not intractable 03/09/2019   Asthma 09/01/2018   Dysmenorrhea 08/26/2016   Tobacco user 08/26/2016   Cervical dysplasia 12/19/2015   History of depression 11/09/2013   Thyroid activity decreased 08/29/2013   Uterine leiomyoma 08/29/2013    Past Surgical History:  Procedure Laterality Date   COLPOSCOPY W/ BIOPSY / CURETTAGE  12/2015    OB History     Gravida  3   Para  1   Term      Preterm  1   AB  2   Living  1      SAB  1   IAB      Ectopic      Multiple      Live Births  1            Home Medications    Prior to Admission medications   Medication Sig Start Date End Date Taking? Authorizing Provider  albuterol (VENTOLIN HFA) 108 (90 Base) MCG/ACT inhaler Inhale 2 puffs into the lungs every 4 (four) hours as needed. 11/10/22  Yes Margarette Canada, NP   benzonatate (TESSALON) 100 MG capsule Take 2 capsules (200 mg total) by mouth every 8 (eight) hours. 11/10/22  Yes Margarette Canada, NP  ipratropium (ATROVENT) 0.06 % nasal spray Place 2 sprays into both nostrils 4 (four) times daily. 11/10/22  Yes Margarette Canada, NP  oseltamivir (TAMIFLU) 75 MG capsule Take 1 capsule (75 mg total) by mouth every 12 (twelve) hours. 11/10/22  Yes Margarette Canada, NP  promethazine-dextromethorphan (PROMETHAZINE-DM) 6.25-15 MG/5ML syrup Take 5 mLs by mouth 4 (four) times daily as needed. 11/10/22  Yes Margarette Canada, NP  norethindrone-ethinyl estradiol (MICROGESTIN) 1-20 MG-MCG tablet Take 1 tablet by mouth daily. 08/30/20   Jerene Dilling, PA  omeprazole (PRILOSEC) 20 MG capsule Take 20 mg by mouth.     [provider]    Family History Family History  Problem Relation Age of Onset   Diabetes Mother     Social History Social History   Tobacco Use   Smoking status: Every Day    Packs/day: 0.25    Types: Cigarettes   Smokeless tobacco: Never  Vaping Use   Vaping Use: Never used  Substance Use Topics   Alcohol use:  Not Currently    Alcohol/week: 2.0 standard drinks of alcohol    Types: 2 Glasses of wine per week    Comment: "i just drink on the weekends"   Drug use: No     Allergies   Patient has no known allergies.   Review of Systems Review of Systems  Constitutional:  Negative for fever.  HENT:  Positive for congestion and rhinorrhea. Negative for ear pain and sore throat.   Eyes:  Positive for discharge and itching. Negative for photophobia, pain, redness and visual disturbance.  Respiratory:  Positive for cough, shortness of breath and wheezing.   Gastrointestinal:  Negative for diarrhea, nausea and vomiting.  Skin:  Negative for rash.     Physical Exam Triage Vital Signs ED Triage Vitals  Enc Vitals Group     BP 11/10/22 1121 138/84     Pulse Rate 11/10/22 1121 97     Resp 11/10/22 1121 18     Temp 11/10/22 1121 98.5 F (36.9 C)      Temp Source 11/10/22 1121 Oral     SpO2 11/10/22 1121 95 %     Weight --      Height --      Head Circumference --      Peak Flow --      Pain Score 11/10/22 1119 0     Pain Loc --      Pain Edu? --      Excl. in [place]? --    No data found.  Updated Vital Signs BP 138/84 (BP Location: Left Arm)   Pulse 97   Temp 98.5 F (36.9 C) (Oral)   Resp 18   LMP 11/02/2022 (Approximate)   SpO2 95%   Visual Acuity Right Eye Distance:   Left Eye Distance:   Bilateral Distance:    Right Eye Near:   Left Eye Near:    Bilateral Near:     Physical Exam Vitals and nursing note reviewed.  Constitutional:      Appearance: Normal appearance. She is not ill-appearing.  HENT:     Head: Normocephalic and atraumatic.     Right Ear: Tympanic membrane, ear canal and external ear normal. There is no impacted cerumen.     Left Ear: Tympanic membrane, ear canal and external ear normal. There is no impacted cerumen.     Nose: Congestion and rhinorrhea present.     Comments: Numbness and edematous with clear rhinorrhea in both nares.    Mouth/Throat:     Mouth: Mucous membranes are moist.     Pharynx: Oropharynx is clear. No oropharyngeal exudate or posterior oropharyngeal erythema.     Comments: Patient is clear postnasal drip in the posterior pharynx but no erythema or injection. Eyes:     General: No scleral icterus.       Right eye: Discharge present.        Left eye: Discharge present.    Extraocular Movements: Extraocular movements intact.     Conjunctiva/sclera: Conjunctivae normal.     Pupils: Pupils are equal, round, and reactive to light.     Comments: Patient has watery discharge from both eyes but her bulbar and labral are free of redness or injection.  Pupils equal and reactive and EOMs intact.  Cardiovascular:     Rate and Rhythm: Normal rate and regular rhythm.     Pulses: Normal pulses.     Heart sounds: Normal heart sounds. No murmur heard.    No friction rub. No  gallop.   Pulmonary:     Effort: Pulmonary effort is normal.     Breath sounds: Wheezing present. No rhonchi or rales.  Musculoskeletal:     Cervical back: Normal range of motion and neck supple.  Lymphadenopathy:     Cervical: No cervical adenopathy.  Skin:    General: Skin is warm and dry.     Capillary Refill: Capillary refill takes less than 2 seconds.     Findings: No rash.  Neurological:     General: No focal deficit present.     Mental Status: She is alert and oriented to person, place, and time.  Psychiatric:        Mood and Affect: Mood normal.        Behavior: Behavior normal.        Thought Content: Thought content normal.        Judgment: Judgment normal.      UC Treatments / Results  Labs (all labs ordered are listed, but only abnormal results are displayed) Labs Reviewed  RESP PANEL BY RT-PCR (RSV, FLU A&B, COVID)  RVPGX2 - Abnormal; Notable for the following components:      Result Value   Influenza B by PCR POSITIVE (*)    All other components within normal limits    EKG   Radiology No results found.  Procedures Procedures (including critical care time)  Medications Ordered in UC Medications - No data to display  Initial Impression / Assessment and Plan / UC Course  I have reviewed the triage vital signs and the nursing notes.  Pertinent labs & imaging results that were available during my care of the patient were reviewed by me and considered in my medical decision making (see chart for details).   Patient is a pleasant, nontoxic-appearing 42 year old female here for evaluation of respiratory complaints as outlined in HPI above.  Patient does have a continual dry cough in the exam room and talking exacerbates the coughing.  When auscultating her chest she does have wheezes diffusely but no crackles or rhonchi.  Her upper respiratory tree does reflect inflammation with inflamed nasal mucosa and clear rhinorrhea.  Also clear postnasal drip.  No cervical  lymphadenopathy present on exam.  I suspect the patient has a viral respiratory infection.  I will order a PCR panel to look for COVID, RSV, and influenza.  Respiratory panel is positive for influenza B.  I will discharge patient home with diagnosis of influenza B and started on Tamiflu 75 mg twice daily for 5 days.  I will also prescribe Tessalon Perles and Promethazine DM cough syrup for cough and congestion.  Atrovent nasal spray to open the nasal congestion and an albuterol inhaler to help with shortness of breath and wheezing.   Final Clinical Impressions(s) / UC Diagnoses   Final diagnoses:  Influenza B     Discharge Instructions      Take the Tamiflu twice daily for 5 days for treatment of influenza.  Use the Atrovent nasal spray, 2 squirts up each nostril every 6 hours, as needed for nasal congestion and runny nose.  The albuterol inhaler, 2 puffs every 4-6 hours, as needed for shortness of breath and wheezing.  Use the Tessalon Perles every 8 hours as needed for cough.  Taken with a small sip of water.  You may experience some numbness to your tongue or metallic taste in her mouth, this is normal.  Use the Promethazine DM cough syrup at bedtime as will make you drowsy  but it should help dry up your postnasal drip and aid you in sleep and cough relief.  Return for reevaluation, or see your primary care provider, for new or worsening symptoms.      ED Prescriptions     Medication Sig Dispense Auth. Provider   albuterol (VENTOLIN HFA) 108 (90 Base) MCG/ACT inhaler Inhale 2 puffs into the lungs every 4 (four) hours as needed. 18 g Margarette Canada, NP   benzonatate (TESSALON) 100 MG capsule Take 2 capsules (200 mg total) by mouth every 8 (eight) hours. 21 capsule Margarette Canada, NP   ipratropium (ATROVENT) 0.06 % nasal spray Place 2 sprays into both nostrils 4 (four) times daily. 15 mL Margarette Canada, NP   promethazine-dextromethorphan (PROMETHAZINE-DM) 6.25-15 MG/5ML syrup Take 5  mLs by mouth 4 (four) times daily as needed. 118 mL Margarette Canada, NP   oseltamivir (TAMIFLU) 75 MG capsule Take 1 capsule (75 mg total) by mouth every 12 (twelve) hours. 10 capsule Margarette Canada, NP      PDMP not reviewed this encounter.   Margarette Canada, NP 11/10/22 1230

## 2022-11-10 NOTE — ED Triage Notes (Signed)
Pt presents with watery and itchy eyes x 2 days. She developed a cough, fatigue and congestion last night.

## 2022-12-27 ENCOUNTER — Other Ambulatory Visit: Payer: Self-pay

## 2022-12-27 ENCOUNTER — Emergency Department
Admission: EM | Admit: 2022-12-27 | Discharge: 2022-12-27 | Disposition: A | Discharge status: Home / Self Care | Payer: No Typology Code available for payment source | Attending: Emergency Medicine | Admitting: Emergency Medicine

## 2022-12-27 ENCOUNTER — Emergency Department: Payer: No Typology Code available for payment source

## 2022-12-27 ENCOUNTER — Encounter: Payer: Self-pay | Admitting: Radiology

## 2022-12-27 DIAGNOSIS — O200 Threatened abortion: Secondary | ICD-10-CM | POA: Diagnosis not present

## 2022-12-27 DIAGNOSIS — O209 Hemorrhage in early pregnancy, unspecified: Secondary | ICD-10-CM | POA: Diagnosis present

## 2022-12-27 LAB — COMPREHENSIVE METABOLIC PANEL
ALT: 19 U/L (ref 0–44)
AST: 23 U/L (ref 15–41)
Albumin: 3.5 g/dL (ref 3.5–5.0)
Alkaline Phosphatase: 42 U/L (ref 38–126)
Anion gap: 5 (ref 5–15)
BUN: 12 mg/dL (ref 6–20)
CO2: 22 mmol/L (ref 22–32)
Calcium: 8.7 mg/dL — ABNORMAL LOW (ref 8.9–10.3)
Chloride: 108 mmol/L (ref 98–111)
Creatinine, Ser: 0.85 mg/dL (ref 0.44–1.00)
GFR, Estimated: >60 mL/min (ref >60)
Glucose, Bld: 85 mg/dL (ref 70–99)
Potassium: 3.7 mmol/L (ref 3.5–5.1)
Sodium: 135 mmol/L (ref 135–145)
Total Bilirubin: 1.1 mg/dL (ref 0.3–1.2)
Total Protein: 7.6 g/dL (ref 6.5–8.1)

## 2022-12-27 LAB — CBC WITH DIFFERENTIAL/PLATELET
Abs Immature Granulocytes: 0.02 10*3/uL (ref 0.00–0.07)
Basophils Absolute: 0.1 10*3/uL (ref 0.0–0.1)
Basophils Relative: 1 %
Eosinophils Absolute: 0.4 10*3/uL (ref 0.0–0.5)
Eosinophils Relative: 6 %
HCT: 39.8 % (ref 36.0–46.0)
Hemoglobin: 12.9 g/dL (ref 12.0–15.0)
Immature Granulocytes: 0 %
Lymphocytes Relative: 24 %
Lymphs Abs: 1.7 10*3/uL (ref 0.7–4.0)
MCH: 29.3 pg (ref 26.0–34.0)
MCHC: 32.4 g/dL (ref 30.0–36.0)
MCV: 90.2 fL (ref 80.0–100.0)
Monocytes Absolute: 0.8 10*3/uL (ref 0.1–1.0)
Monocytes Relative: 11 %
Neutro Abs: 4.1 10*3/uL (ref 1.7–7.7)
Neutrophils Relative %: 58 %
Platelets: 299 10*3/uL (ref 150–400)
RBC: 4.41 MIL/uL (ref 3.87–5.11)
RDW: 14.6 % (ref 11.5–15.5)
WBC: 7 10*3/uL (ref 4.0–10.5)
nRBC: 0 % (ref 0.0–0.2)

## 2022-12-27 LAB — ABO/RH: ABO/RH(D): A POS

## 2022-12-27 LAB — HCG, QUANTITATIVE, PREGNANCY: hCG, Beta Chain, Quant, S: 2808 m[IU]/mL — ABNORMAL HIGH (ref <5)

## 2022-12-27 MED ORDER — ACETAMINOPHEN 325 MG PO TABS
650.0000 mg | ORAL_TABLET | Freq: Once | ORAL | Status: DC
  Filled 2022-12-27: qty 2

## 2022-12-27 MED ORDER — SODIUM CHLORIDE 0.9 % IV BOLUS
1000.0000 mL | Freq: Once | INTRAVENOUS | Status: AC
  Administered 2022-12-27: 1000 mL via INTRAVENOUS

## 2022-12-27 MED ORDER — KETOROLAC TROMETHAMINE 15 MG/ML IJ SOLN: 15.0000 mg | Freq: Once | INTRAMUSCULAR | Status: DC

## 2022-12-27 MED ORDER — KETOROLAC TROMETHAMINE 15 MG/ML IJ SOLN
15.0000 mg | Freq: Once | INTRAMUSCULAR | Status: AC
  Administered 2022-12-27: 15 mg via INTRAVENOUS
  Filled 2022-12-27: qty 1

## 2022-12-27 NOTE — Discharge Instructions (Signed)
Please call [facility] tomorrow for your repeat blood hCG level.  They are expecting your phone call.  Please return for any new, worsening, or change in symptoms or other concerns.  It was a pleasure caring for you today.

## 2022-12-27 NOTE — ED Provider Notes (Signed)
[facility] Provider Note    Event Date/Time   First MD Initiated Contact with Patient 12/27/22 1337     (approximate)   History   Threatened Miscarriage   HPI  Heidi Hill is a 42 y.o. female with a past medical history of multiple spontaneous and medical abortions, 1 living child who presents today for evaluation of vaginal bleeding.  Patient reports that she thinks that she is miscarrying.  She reports that she took a pregnancy test 2 weeks ago which was positive.  She reports that she began to have spotting yesterday, and then heavier bleeding today.  She reports that she has pain across her low abdomen and also her back.     Physical Exam   Triage Vital Signs: ED Triage Vitals  Enc Vitals Group     BP 12/27/22 1320 (!) 158/101     Pulse Rate 12/27/22 1320 99     Resp 12/27/22 1320 18     Temp 12/27/22 1320 (!) 97.5 F (36.4 C)     Temp Source 12/27/22 1320 Oral     SpO2 12/27/22 1320 96 %     Weight 12/27/22 1323 200 lb (90.7 kg)     Height 12/27/22 1323 '5\' 7"'$  (1.702 m)     Head Circumference --      Peak Flow --      Pain Score --      Pain Loc --      Pain Edu? --      Excl. in [place]? --     Most recent vital signs: Vitals:   12/27/22 1320 12/27/22 1646  BP: (!) 158/101 126/71  Pulse: 99 84  Resp: 18 18  Temp: (!) 97.5 F (36.4 C)   SpO2: 96% 94%    Physical Exam Vitals and nursing note reviewed.  Constitutional:      General: Awake and alert. No acute distress.    Appearance: Normal appearance. The patient is overweight.  HENT:     Head: Normocephalic and atraumatic.     Mouth: Mucous membranes are moist.  Eyes:     General: PERRL. Normal EOMs        Right eye: No discharge.        Left eye: No discharge.     Conjunctiva/sclera: Conjunctivae normal.  Cardiovascular:     Rate and Rhythm: Normal rate and regular rhythm.     Pulses: Normal pulses.  Pulmonary:     Effort: Pulmonary effort is normal. No respiratory  distress.     Breath sounds: Normal breath sounds.  Abdominal:     Abdomen is soft. There is no abdominal tenderness. No rebound or guarding. No distention. GU: Large clot in vaginal vault, removed.  Bright red blood in vaginal vault, easily soaked up with large cotton swab and bleeding controlled.  No other discharge noted.   Musculoskeletal:        General: No swelling. Normal range of motion.     Cervical back: Normal range of motion and neck supple.  Skin:    General: Skin is warm and dry.     Capillary Refill: Capillary refill takes less than 2 seconds.     Findings: No rash.  Neurological:     Mental Status: The patient is awake and alert.      ED Results / Procedures / Treatments   Labs (all labs ordered are listed, but only abnormal results are displayed) Labs Reviewed  HCG, QUANTITATIVE, PREGNANCY - Abnormal;  Notable for the following components:      Result Value   hCG, Beta Chain, Quant, S 2,808 (*)    All other components within normal limits  COMPREHENSIVE METABOLIC PANEL - Abnormal; Notable for the following components:   Calcium 8.7 (*)    All other components within normal limits  CBC WITH DIFFERENTIAL/PLATELET  URINALYSIS, ROUTINE W REFLEX MICROSCOPIC  ABO/RH     EKG     RADIOLOGY I independently reviewed and interpreted imaging and agree with radiologists findings.     PROCEDURES:  Critical Care performed:   Procedures   MEDICATIONS ORDERED IN ED: Medications  acetaminophen (TYLENOL) tablet 650 mg (650 mg Oral Not Given 12/27/22 1453)  sodium chloride 0.9 % bolus 1,000 mL (0 mLs Intravenous Stopped 12/27/22 1727)  ketorolac (TORADOL) 15 MG/ML injection 15 mg (15 mg Intravenous Given 12/27/22 1650)     IMPRESSION / MDM / ASSESSMENT AND PLAN / ED COURSE  I reviewed the triage vital signs and the nursing notes.   Differential diagnosis includes, but is not limited to, inevitable abortion, spontaneous abortion, ectopic pregnancy,  subchorionic hemorrhage, fibroids.  Patient is awake and alert, hemodynamically stable and afebrile.  She is nontoxic-appearing.  She is not tachycardic or hypotensive.  Labs obtained are overall reassuring with stable H&H.  She has an elevated serum hCG to 2808.  Pelvic ultrasound obtained reveals no confirmed IUP.  I discussed with OB/GYN on-call Gigi Gin who discussed with Dr. Marcelline Mates who reports that it looks like she is passing the pregnancy and does not require any further workup at this time.  They recommend serial hCG, and reports that she can get this at [facility].  She will call tomorrow to make an appointment.  Pain was controlled with Toradol after discussion with OB/GYN.  She was also given IV fluids per OB/GYN recommendations.  Discussed all findings and recommendations with the patient who agrees with plan.  We also discussed return precautions.  Patient was reevaluated multiple times throughout her emergency department stay and remained hemodynamically stable.  She reports that her pain is significantly improved as is her bleeding and she is requesting discharge.  Patient understands and agrees with plan.  She was discharged in stable condition.   Patient's presentation is most consistent with acute presentation with potential threat to life or bodily function.   Clinical Course as of 12/27/22 1743  Nancy Fetter Dec 27, 2022  1718 Discussed with OB/GYN who feels that she is appropriate for discharge, patient will follow-up in clinic for repeat hCG in 1 to 2 days [JP]  1721 Patient reports that she feels significantly improved and would like to be discharged.  She reports that her bleeding has slowed down and she feels comfortable going home [JP]    Clinical Course User Index [JP] Kalel Harty, Clarnce Flock, PA-C     FINAL CLINICAL IMPRESSION(S) / ED DIAGNOSES   Final diagnoses:  Threatened abortion in early pregnancy     Rx / DC Orders   ED Discharge Orders     None         Note:  This document was prepared using Dragon voice recognition software and may include unintentional dictation errors.   Emeline Gins 12/27/22 1743    Harvest Dark, MD 12/30/22 1359

## 2022-12-27 NOTE — ED Triage Notes (Signed)
Pt states she took a pregnancy test 2 weeks ago and found out she was pregnant. Last period was January. Pt states she started spotting yesterday but when she woke today she began bleeding very heavily. Pt states she has soaked through 4 pads since 12pm.

## 2022-12-29 ENCOUNTER — Other Ambulatory Visit: Payer: Self-pay

## 2022-12-29 ENCOUNTER — Other Ambulatory Visit: Payer: No Typology Code available for payment source

## 2022-12-29 ENCOUNTER — Telehealth: Payer: Self-pay

## 2022-12-29 DIAGNOSIS — Z8759 Personal history of other complications of pregnancy, childbirth and the puerperium: Secondary | ICD-10-CM

## 2022-12-29 NOTE — Telephone Encounter (Signed)
Heidi Hill called the office she was transferred to triage, she was seen in the ED 12/27/22 for a miscarriage and they advised her to follow up with [place], She's a new patient and accroding to the ladies up front we have no open slots.  Please advise.

## 2022-12-30 LAB — BETA HCG QUANT (REF LAB): hCG Quant: 386 m[IU]/mL

## 2022-12-31 ENCOUNTER — Encounter: Payer: Self-pay | Admitting: Obstetrics and Gynecology

## 2022-12-31 ENCOUNTER — Other Ambulatory Visit: Payer: Self-pay

## 2022-12-31 ENCOUNTER — Other Ambulatory Visit: Payer: No Typology Code available for payment source

## 2022-12-31 DIAGNOSIS — Z8759 Personal history of other complications of pregnancy, childbirth and the puerperium: Secondary | ICD-10-CM

## 2023-01-01 LAB — BETA HCG QUANT (REF LAB): hCG Quant: 172 m[IU]/mL

## 2023-01-08 ENCOUNTER — Telehealth: Payer: Self-pay

## 2023-01-08 NOTE — Telephone Encounter (Signed)
Patient called. Has had a miscarriage and not feeling like herself. I asked her the first two depression screening questions and she scored a 6. She denies thoughts of self harm. She would like to be seen to discuss her feelings. I transferred call to front to see if she could be scheduled.

## 2023-01-29 ENCOUNTER — Ambulatory Visit: Payer: No Typology Code available for payment source | Admitting: Obstetrics & Gynecology

## 2023-09-27 ENCOUNTER — Ambulatory Visit: Payer: BC Managed Care – PPO | Admitting: Nurse Practitioner

## 2023-09-27 ENCOUNTER — Encounter: Payer: Self-pay | Admitting: Nurse Practitioner

## 2023-09-27 ENCOUNTER — Ambulatory Visit: Payer: BC Managed Care – PPO

## 2023-09-27 DIAGNOSIS — Z202 Contact with and (suspected) exposure to infections with a predominantly sexual mode of transmission: Secondary | ICD-10-CM

## 2023-09-27 DIAGNOSIS — Z113 Encounter for screening for infections with a predominantly sexual mode of transmission: Secondary | ICD-10-CM

## 2023-09-27 LAB — HM HEPATITIS C SCREENING LAB: HM Hepatitis Screen: NEGATIVE

## 2023-09-27 LAB — HM HIV SCREENING LAB: HM HIV Screening: NEGATIVE

## 2023-09-27 MED ORDER — PENICILLIN G BENZATHINE 1200000 UNIT/2ML IM SUSY
2.4000 10*6.[IU] | PREFILLED_SYRINGE | Freq: Once | INTRAMUSCULAR | Status: AC
  Administered 2023-09-27: 2.4 10*6.[IU] via INTRAMUSCULAR

## 2023-09-27 NOTE — Progress Notes (Signed)
Pt is here as a contact to Syphilis and additional STD testing.  Wet mount results reviewed, no additional treatment required per SO. Bicillin 2.4 MU given IM without any complications.  Pt monitored for 15 minutes.  Condoms declined.  Berdie Ogren, RN

## 2023-09-28 LAB — WET PREP FOR TRICH, YEAST, CLUE
Trichomonas Exam: NEGATIVE
Yeast Exam: NEGATIVE

## 2023-10-02 LAB — GONOCOCCUS CULTURE

## 2023-10-03 NOTE — Progress Notes (Signed)
[facility] Department  STI clinic/screening visit [address] [place] [address] [phone]  Subjective:  Heidi Hill is a 42 y.o. female being seen today for an STI screening visit. The patient reports they do not have symptoms.  Patient reports that they do not desire a pregnancy in the next year.   They reported they are not interested in discussing contraception today.    Patient's last menstrual period was 09/13/2023 (exact date).  Patient has the following medical conditions:   Patient Active Problem List   Diagnosis Date Noted   Strep pharyngitis 12/30/2021   Elevated blood pressure reading 12/30/2021   Epiglottitis 03/21/2020   Tonsillitis 03/21/2020   Migraine with aura and without status migrainosus, not intractable 03/09/2019   Asthma 09/01/2018   Dysmenorrhea 08/26/2016   Tobacco user 08/26/2016   Cervical dysplasia 12/19/2015   History of depression 11/09/2013   Thyroid activity decreased 08/29/2013   Uterine leiomyoma 08/29/2013    Chief Complaint  Patient presents with   SEXUALLY TRANSMITTED DISEASE    Contact to Syphilis    Patient presents to clinic today requesting STI testing and reports being a contact to a syphilis positive partner. Patient indicates she is asymptomatic. She also indicates being positive for trich less than 1 month ago and would like repeat testing. She reports 1 female partner in the last two months, has oral and vaginal sex without condoms. She also endorses using OCP.  Patient indicates a history of anxiety. She was offered Kathreen Cosier, LCSW card, but declined.  PAP History: 12/25/15: HPV (+), no cytology 09/01/18: ASCUS, HPV (-) Patient due for PAP.   Does the patient using douching products? Not assessed.   Last HIV test per patient/review of record was  Lab Results  Component Value Date   HMHIVSCREEN Negative - Validated 09/27/2023    Lab Results  Component Value Date   HIV Non Reactive  03/21/2020     Last HEPC test per patient/review of record was  Lab Results  Component Value Date   HMHEPCSCREEN Negative-Validated 09/27/2023   No components found for: "HEPC"   Last HEPB test per patient/review of record was No components found for: "HMHEPBSCREEN" No components found for: "HEPC"   Patient reports last pap was  Lab Results  Component Value Date   DIAGPAP (A) 09/01/2018    ATYPICAL SQUAMOUS CELLS OF UNDETERMINED SIGNIFICANCE (ASC-US).   No results found for: "SPECADGYN"  Screening for MPX risk: Does the patient have an unexplained rash? No Is the patient MSM? No Does the patient endorse multiple sex partners or anonymous sex partners? No Did the patient have close or sexual contact with a person diagnosed with MPX? No Has the patient traveled outside the [place] where MPX is endemic? No Is there a high clinical suspicion for MPX-- evidenced by one of the following No  -Unlikely to be chickenpox  -Lymphadenopathy  -Rash that present in same phase of evolution on any given body part See flowsheet for further details and programmatic requirements.   Immunization history:   There is no immunization history on file for this patient.   The following portions of the patient's history were reviewed and updated as appropriate: allergies, current medications, past medical history, past social history, past surgical history and problem list.  Objective:  There were no vitals filed for this visit.  Physical Exam Nursing note reviewed.  Constitutional:      Appearance: Normal appearance.  HENT:     Head:  Normocephalic.     Salivary Glands: Right salivary gland is not diffusely enlarged or tender. Left salivary gland is not diffusely enlarged or tender.     Mouth/Throat:     Lips: Pink. No lesions.     Mouth: Mucous membranes are moist.     Tongue: No lesions. Tongue does not deviate from midline.     Pharynx: Oropharynx is clear. Uvula midline. No oropharyngeal  exudate or posterior oropharyngeal erythema.     Tonsils: No tonsillar exudate.  Eyes:     General:        Right eye: No discharge.        Left eye: No discharge.  Pulmonary:     Effort: Pulmonary effort is normal.  Genitourinary:    Comments: Patient declined symptoms. Self-swabbed.  Lymphadenopathy:     Head:     Right side of head: No submental, submandibular, tonsillar, preauricular or posterior auricular adenopathy.     Left side of head: No submental, submandibular, tonsillar, preauricular or posterior auricular adenopathy.     Cervical: No cervical adenopathy.     Right cervical: No superficial or posterior cervical adenopathy.    Left cervical: No superficial or posterior cervical adenopathy.     Upper Body:     Right upper body: No supraclavicular or axillary adenopathy.     Left upper body: No supraclavicular or axillary adenopathy.  Skin:    General: Skin is warm and dry.     Findings: No rash.     Comments: Skin tone appropriate for ethnicity. No rash present to back, arms or hands.   Neurological:     Mental Status: She is alert and oriented to person, place, and time.  Psychiatric:        Attention and Perception: Attention normal.        Mood and Affect: Mood and affect normal.        Speech: Speech normal.        Behavior: Behavior normal. Behavior is cooperative.        Thought Content: Thought content normal.    Assessment and Plan:  Heidi Hill is a 42 y.o. female presenting to the [facility] Department for STI screening  1. Screening for venereal disease (Primary)  - Chlamydia/Gonorrhea [facility] - HIV/HCV [facility] - Syphilis Serology,  Lab - WET PREP FOR TRICH, YEAST, CLUE - Gonococcus culture  2. Syphilis contact Patient treated as a contact to syphilis. If patient's syphilis test comes back positive patient will need additional treatment unless time of infection can be determined. Most recent syphilis RPR negative in  2019.  - penicillin g benzathine (BICILLIN LA) 1200000 UNIT/2ML injection 2.4 Million Units  Patient accepted all screenings including oral, vaginal CT/GC and bloodwork for HIV/RPR, and wet prep. Patient meets criteria for HepB screening? No. Ordered? not applicable Patient meets criteria for HepC screening? Yes. Ordered? yes  Treat wet prep per standing order Discussed time line for [facility] results and that patient will be called with positive results and encouraged patient to call if she had not heard in 2 weeks.  Counseled to return or seek care for continued or worsening symptoms Recommended repeat testing in 3 months with positive results. Recommended condom use with all sex  Patient is currently using  oral contraceptive pills  to prevent pregnancy.    Return if symptoms worsen or fail to improve.  No future appointments.  Edmonia James, NP

## 2023-12-27 ENCOUNTER — Ambulatory Visit (INDEPENDENT_AMBULATORY_CARE_PROVIDER_SITE_OTHER)

## 2023-12-27 ENCOUNTER — Ambulatory Visit
Admission: EM | Admit: 2023-12-27 | Discharge: 2023-12-27 | Disposition: A | Discharge status: Home / Self Care | Attending: Physician Assistant | Admitting: Physician Assistant

## 2023-12-27 ENCOUNTER — Encounter: Payer: Self-pay | Admitting: Physician Assistant

## 2023-12-27 DIAGNOSIS — Z1152 Encounter for screening for COVID-19: Secondary | ICD-10-CM | POA: Insufficient documentation

## 2023-12-27 DIAGNOSIS — J45901 Unspecified asthma with (acute) exacerbation: Secondary | ICD-10-CM | POA: Diagnosis not present

## 2023-12-27 DIAGNOSIS — B349 Viral infection, unspecified: Secondary | ICD-10-CM | POA: Diagnosis not present

## 2023-12-27 DIAGNOSIS — R509 Fever, unspecified: Secondary | ICD-10-CM | POA: Diagnosis not present

## 2023-12-27 DIAGNOSIS — R051 Acute cough: Secondary | ICD-10-CM

## 2023-12-27 LAB — RESP PANEL BY RT-PCR (FLU A&B, COVID) ARPGX2
Influenza A by PCR: NEGATIVE
Influenza B by PCR: NEGATIVE
SARS Coronavirus 2 by RT PCR: NEGATIVE

## 2023-12-27 MED ORDER — PROMETHAZINE-DM 6.25-15 MG/5ML PO SYRP: 5.0000 mL | ORAL_SOLUTION | Freq: Four times a day (QID) | ORAL | 0 refills | Status: AC | PRN

## 2023-12-27 MED ORDER — PREDNISONE 20 MG PO TABS: 40.0000 mg | ORAL_TABLET | Freq: Every day | ORAL | 0 refills | Status: AC

## 2023-12-27 NOTE — ED Provider Notes (Signed)
 [facility]    CSN: [id number] Arrival date & time: 12/27/23  1513      History   Chief Complaint Chief Complaint  Patient presents with   Cough   Fever   Diarrhea    HPI Heidi Hill is a 43 y.o. female with history of asthma presenting for progressively worsening cough and congestion. States symptoms worsened over the past 2 days. She now has low grade fever, increased fatigue, body aches, diarrhea, chest pain and shortness of breath. Denies sore throat, sinus pain, abdominal pain. Has been using albuterol more than normal. Has also been taking Mucinex without relief. No other concerns.  HPI  Past Medical History:  Diagnosis Date   Asthma    Fibroids    GERD (gastroesophageal reflux disease)    Vaginal Pap smear, abnormal     Patient Active Problem List   Diagnosis Date Noted   Strep pharyngitis 12/30/2021   Elevated blood pressure reading 12/30/2021   Epiglottitis 03/21/2020   Tonsillitis 03/21/2020   Migraine with aura and without status migrainosus, not intractable 03/09/2019   Asthma 09/01/2018   Dysmenorrhea 08/26/2016   Tobacco user 08/26/2016   Cervical dysplasia 12/19/2015   History of depression 11/09/2013   Thyroid activity decreased 08/29/2013   Uterine leiomyoma 08/29/2013    Past Surgical History:  Procedure Laterality Date   COLPOSCOPY W/ BIOPSY / CURETTAGE  12/2015    OB History     Gravida  3   Para  1   Term      Preterm  1   AB  2   Living  1      SAB  1   IAB      Ectopic      Multiple      Live Births  1            Home Medications    Prior to Admission medications   Medication Sig Start Date End Date Taking? Authorizing Provider  ADVAIR DISKUS 250-50 MCG/ACT AEPB 1 puff 2 (two) times daily.   Yes [provider]  busPIRone (BUSPAR) 5 MG tablet Take by mouth. 06/04/23 06/03/24 Yes [provider]  gabapentin (NEURONTIN) 300 MG capsule Take 1 capsule (300 mg total) by mouth  Three (3) times a day as needed. 12/13/23  Yes [provider]  norethindrone-ethinyl estradiol (MICROGESTIN) 1-20 MG-MCG tablet Take 1 tablet by mouth daily. 08/30/20  Yes Hampton, Marylynn Pearson, PA  omeprazole (PRILOSEC) 20 MG capsule Take 20 mg by mouth.    Yes [provider]  predniSONE (DELTASONE) 20 MG tablet Take 2 tablets (40 mg total) by mouth daily for 5 days. 12/27/23 01/01/24 Yes Shirlee Latch, PA-C  promethazine-dextromethorphan (PROMETHAZINE-DM) 6.25-15 MG/5ML syrup Take 5 mLs by mouth 4 (four) times daily as needed. 12/27/23  Yes Eusebio Friendly B, PA-C  albuterol (VENTOLIN HFA) 108 (90 Base) MCG/ACT inhaler Inhale 2 puffs into the lungs every 4 (four) hours as needed. Patient not taking: Reported on 09/27/2023 11/10/22   Becky Augusta, NP    Family History Family History  Problem Relation Age of Onset   Diabetes Mother     Social History Social History   Tobacco Use   Smoking status: Every Day    Current packs/day: 2.00    Types: Cigarettes   Smokeless tobacco: Never  Vaping Use   Vaping status: Never Used  Substance Use Topics   Alcohol use: Yes    Alcohol/week: 2.0 standard drinks of alcohol  Types: 2 Glasses of wine per week    Comment: pt states drank last night   Drug use: No     Allergies   Patient has no known allergies.   Review of Systems Review of Systems  Constitutional:  Positive for fatigue and fever. Negative for chills and diaphoresis.  HENT:  Positive for congestion and rhinorrhea. Negative for ear pain, sinus pressure, sinus pain and sore throat.   Respiratory:  Positive for cough, shortness of breath and wheezing.   Cardiovascular:  Positive for chest pain.  Gastrointestinal:  Positive for diarrhea. Negative for abdominal pain, nausea and vomiting.  Musculoskeletal:  Positive for myalgias.  Skin:  Negative for rash.  Neurological:  Negative for weakness and headaches.  Hematological:  Negative for adenopathy.     Physical  Exam Triage Vital Signs ED Triage Vitals  Encounter Vitals Group     BP 12/27/23 1546 (!) 159/115     Systolic BP Percentile --      Diastolic BP Percentile --      Pulse Rate 12/27/23 1546 97     Resp 12/27/23 1546 16     Temp 12/27/23 1546 98.6 F (37 C)     Temp Source 12/27/23 1546 Oral     SpO2 12/27/23 1546 95 %     Weight 12/27/23 1545 280 lb (127 kg)     Height 12/27/23 1545 5\' 7"  (1.702 m)     Head Circumference --      Peak Flow --      Pain Score 12/27/23 1549 0     Pain Loc --      Pain Education --      Exclude from Growth Chart --    No data found.  Updated Vital Signs BP (!) 159/115 (BP Location: Right Arm)   Pulse 97   Temp 98.6 F (37 C) (Oral)   Resp 16   Ht 5\' 7"  (1.702 m)   Wt 280 lb (127 kg)   LMP 12/20/2023 (Approximate)   SpO2 95%   BMI 43.85 kg/m   Physical Exam Vitals and nursing note reviewed.  Constitutional:      General: She is not in acute distress.    Appearance: Normal appearance. She is ill-appearing. She is not toxic-appearing.  HENT:     Head: Normocephalic and atraumatic.     Nose: Congestion present.     Mouth/Throat:     Mouth: Mucous membranes are moist.     Pharynx: Oropharynx is clear. Posterior oropharyngeal erythema present.  Eyes:     General: No scleral icterus.       Right eye: No discharge.        Left eye: No discharge.     Conjunctiva/sclera: Conjunctivae normal.  Cardiovascular:     Rate and Rhythm: Normal rate and regular rhythm.     Heart sounds: Normal heart sounds.  Pulmonary:     Effort: Pulmonary effort is normal. No respiratory distress.     Breath sounds: Wheezing present.  Musculoskeletal:     Cervical back: Neck supple.  Skin:    General: Skin is dry.  Neurological:     General: No focal deficit present.     Mental Status: She is alert. Mental status is at baseline.     Motor: No weakness.     Gait: Gait normal.  Psychiatric:        Mood and Affect: Mood normal.        Behavior: Behavior  normal.  UC Treatments / Results  Labs (all labs ordered are listed, but only abnormal results are displayed) Labs Reviewed  RESP PANEL BY RT-PCR (FLU A&B, COVID) [id number]    EKG   Radiology No results found.  Procedures Procedures (including critical care time)  Medications Ordered in UC Medications - No data to display  Initial Impression / Assessment and Plan / UC Course  I have reviewed the triage vital signs and the nursing notes.  Pertinent labs & imaging results that were available during my care of the patient were reviewed by me and considered in my medical decision making (see chart for details).   43 y/o female with history of asthma presents for 2 week history of worsening cough and congestion. Has had low grade fever up to 100 degrees, increased fatigue, body aches, diarrhea, chest pain, and SOB x 2 days.   BP elevated at 159/115. Other vitals are normal and stable. On exam, she has nasal congestion and erythema of posterior pharynx. Few scattered wheezes. Heart RRR.   Resp panel and CXR obtained.  Advised patient she will be contacted with results through MyChart but I will call her if anything is abnormal.  Suspected viral illness and asthma exacerbation. Treating at this time with promethazine DM, albuterol at home and prednisone.   Negative respiratory panel and normal chest x-ray.   Final Clinical Impressions(s) / UC Diagnoses   Final diagnoses:  Acute cough  Viral illness  Asthma with acute exacerbation, unspecified asthma severity, unspecified whether persistent  Low grade fever     Discharge Instructions      -Pending flu, COVID testing and chest x-ray results.  I will contact you with any positive results via phone, otherwise I will message you on MyChart. - I sent cough medicine and prednisone corticosteroid.  Continue using your inhaler as needed. - If you do not hear from me, your testing is negative and you have a viral illness.   Bronchitis is a chest cold and can last for up to 6 weeks.  You should return if you have a higher fever, greater than 101, increased chest discomfort or increased breathing problem.   ED Prescriptions     Medication Sig Dispense Auth. Provider   predniSONE (DELTASONE) 20 MG tablet Take 2 tablets (40 mg total) by mouth daily for 5 days. 10 tablet Shirlee Latch, PA-C   promethazine-dextromethorphan (PROMETHAZINE-DM) 6.25-15 MG/5ML syrup Take 5 mLs by mouth 4 (four) times daily as needed. 118 mL Shirlee Latch, PA-C      PDMP not reviewed this encounter.   Shirlee Latch, PA-C 12/27/23 [phone]

## 2023-12-27 NOTE — Discharge Instructions (Signed)
-  Pending flu, COVID testing and chest x-ray results.  I will contact you with any positive results via phone, otherwise I will message you on MyChart. - I sent cough medicine and prednisone corticosteroid.  Continue using your inhaler as needed. - If you do not hear from me, your testing is negative and you have a viral illness.  Bronchitis is a chest cold and can last for up to 6 weeks.  You should return if you have a higher fever, greater than 101, increased chest discomfort or increased breathing problem.

## 2023-12-27 NOTE — ED Triage Notes (Signed)
 Pt c/o cough & fever x2 wks. Tmax 99.9 this AM. Also c/o diarrhea since this AM. Has tried mucinex w/o relief.
